# Patient Record
Sex: Female | Born: 1960 | Race: Black or African American | Hispanic: No | Marital: Single | Smoking: Current every day smoker
Health system: Southern US, Community
[De-identification: ages and names within clinical notes are randomized; demographics above are authoritative.]

## PROBLEM LIST (undated history)

## (undated) ENCOUNTER — Emergency Department (HOSPITAL_BASED_OUTPATIENT_CLINIC_OR_DEPARTMENT_OTHER): Payer: Medicaid Other

## (undated) DIAGNOSIS — T4145XA Adverse effect of unspecified anesthetic, initial encounter: Secondary | ICD-10-CM

## (undated) DIAGNOSIS — F32A Depression, unspecified: Secondary | ICD-10-CM

## (undated) DIAGNOSIS — F329 Major depressive disorder, single episode, unspecified: Secondary | ICD-10-CM

## (undated) DIAGNOSIS — T8859XA Other complications of anesthesia, initial encounter: Secondary | ICD-10-CM

## (undated) DIAGNOSIS — J302 Other seasonal allergic rhinitis: Secondary | ICD-10-CM

## (undated) DIAGNOSIS — M1990 Unspecified osteoarthritis, unspecified site: Secondary | ICD-10-CM

## (undated) DIAGNOSIS — F419 Anxiety disorder, unspecified: Secondary | ICD-10-CM

## (undated) DIAGNOSIS — I10 Essential (primary) hypertension: Secondary | ICD-10-CM

## (undated) HISTORY — DX: Depression, unspecified: F32.A

## (undated) HISTORY — DX: Major depressive disorder, single episode, unspecified: F32.9

## (undated) HISTORY — DX: Other seasonal allergic rhinitis: J30.2

## (undated) HISTORY — DX: Essential (primary) hypertension: I10

## (undated) HISTORY — PX: POLYPECTOMY: SHX149

## (undated) HISTORY — PX: COLONOSCOPY: SHX174

## (undated) HISTORY — DX: Anxiety disorder, unspecified: F41.9

---

## 1999-11-22 ENCOUNTER — Other Ambulatory Visit: Admission: RE | Admit: 1999-11-22 | Discharge: 1999-11-22 | Payer: Self-pay | Admitting: Obstetrics and Gynecology

## 2000-12-03 ENCOUNTER — Other Ambulatory Visit: Admission: RE | Admit: 2000-12-03 | Discharge: 2000-12-03 | Payer: Self-pay | Admitting: Obstetrics and Gynecology

## 2001-12-31 ENCOUNTER — Other Ambulatory Visit: Admission: RE | Admit: 2001-12-31 | Discharge: 2001-12-31 | Payer: Self-pay | Admitting: Obstetrics and Gynecology

## 2003-01-28 ENCOUNTER — Other Ambulatory Visit: Admission: RE | Admit: 2003-01-28 | Discharge: 2003-01-28 | Payer: Self-pay | Admitting: Obstetrics and Gynecology

## 2004-01-06 ENCOUNTER — Other Ambulatory Visit: Admission: RE | Admit: 2004-01-06 | Discharge: 2004-01-06 | Payer: Self-pay | Admitting: Obstetrics and Gynecology

## 2004-02-29 ENCOUNTER — Inpatient Hospital Stay (HOSPITAL_COMMUNITY): Admission: AD | Admit: 2004-02-29 | Discharge: 2004-02-29 | Payer: Self-pay | Admitting: Obstetrics & Gynecology

## 2004-05-31 ENCOUNTER — Encounter: Admission: RE | Admit: 2004-05-31 | Discharge: 2004-05-31 | Payer: Self-pay | Admitting: Obstetrics and Gynecology

## 2004-07-12 ENCOUNTER — Inpatient Hospital Stay (HOSPITAL_COMMUNITY): Admission: AD | Admit: 2004-07-12 | Discharge: 2004-07-17 | Payer: Self-pay | Admitting: Obstetrics and Gynecology

## 2004-07-12 ENCOUNTER — Inpatient Hospital Stay (HOSPITAL_COMMUNITY): Admission: AD | Admit: 2004-07-12 | Discharge: 2004-07-12 | Payer: Self-pay | Admitting: Obstetrics and Gynecology

## 2004-07-13 ENCOUNTER — Encounter (INDEPENDENT_AMBULATORY_CARE_PROVIDER_SITE_OTHER): Payer: Self-pay | Admitting: Specialist

## 2004-07-18 ENCOUNTER — Encounter: Admission: RE | Admit: 2004-07-18 | Discharge: 2004-08-17 | Payer: Self-pay | Admitting: Obstetrics and Gynecology

## 2004-08-30 ENCOUNTER — Other Ambulatory Visit: Admission: RE | Admit: 2004-08-30 | Discharge: 2004-08-30 | Payer: Self-pay | Admitting: Obstetrics and Gynecology

## 2005-12-07 IMAGING — US US FETAL BPP W/O NONSTRESS
1 series · 13 of 28 positions shown · non-contrast
Comparison: none

CLINICAL DATA: BPP; non-reactive non-stress test; evaluate fetal weight; assigned gestational age is 39 weeks 3 days.

[Series 1: us fetal bpp w/o nonstress · 0.43mm/px · 13 of 55 slices shown]
[im 3/55]
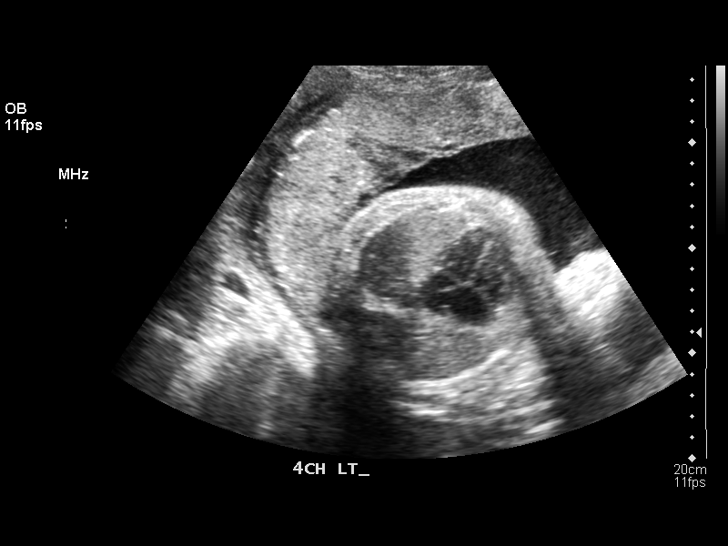
[im 7/55]
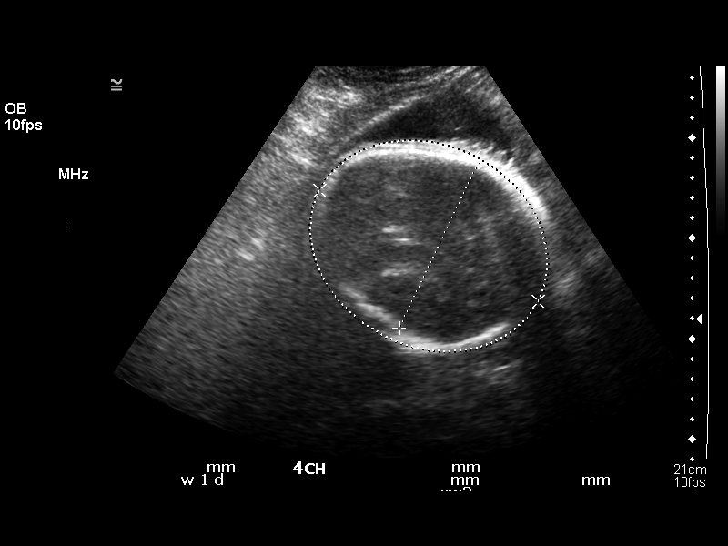
[im 11/55]
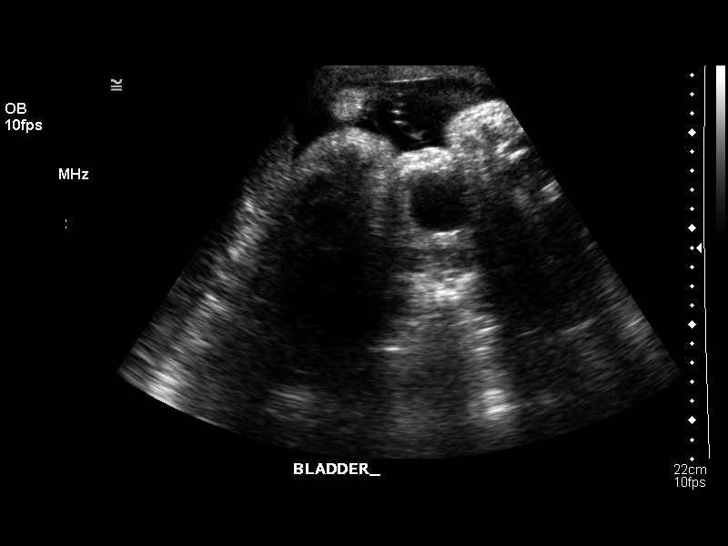
[im 15/55]
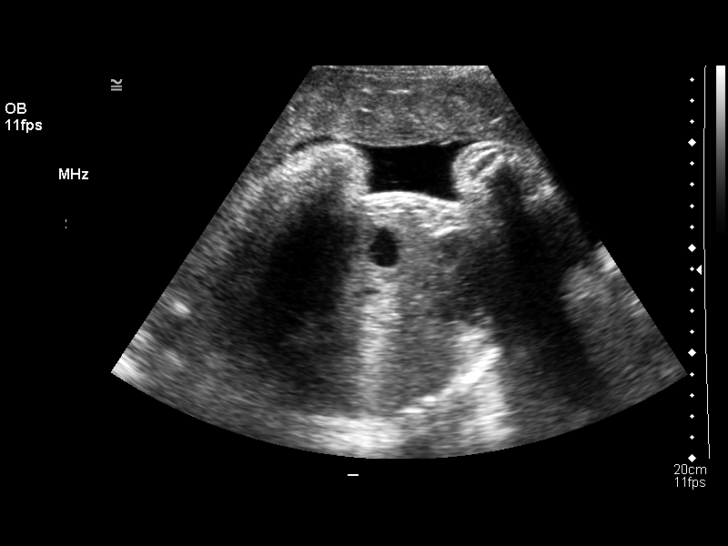
[im 19/55]
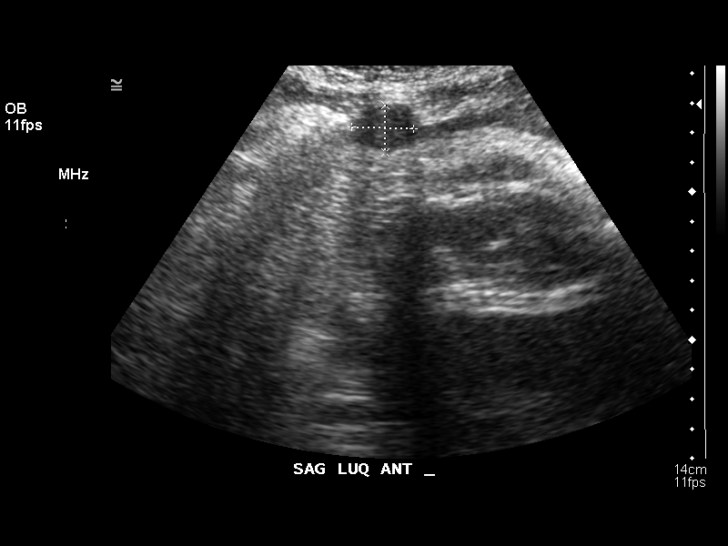
[im 23/55]
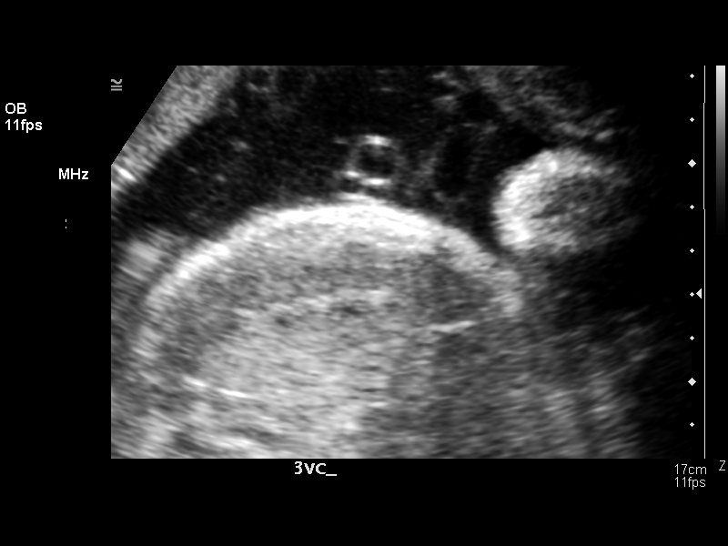
[im 29/55]
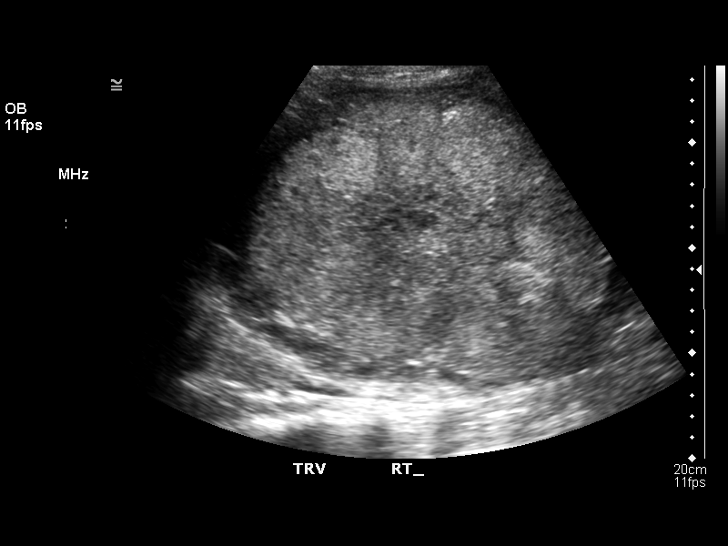
[im 33/55]
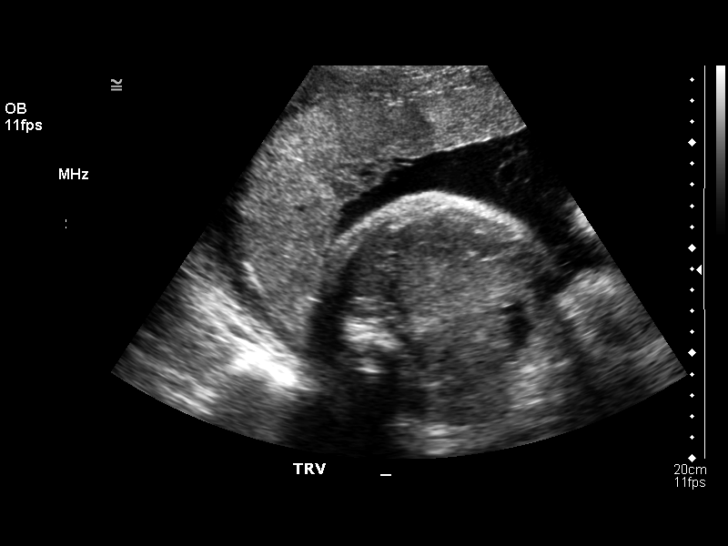
[im 37/55]
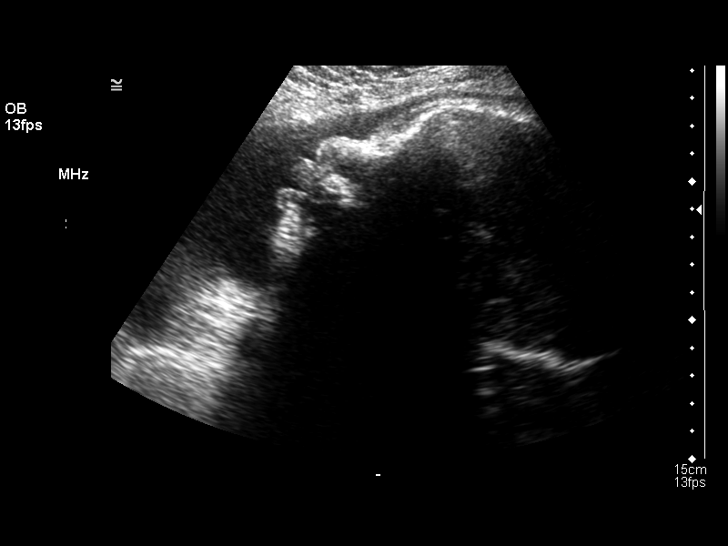
[im 41/55]
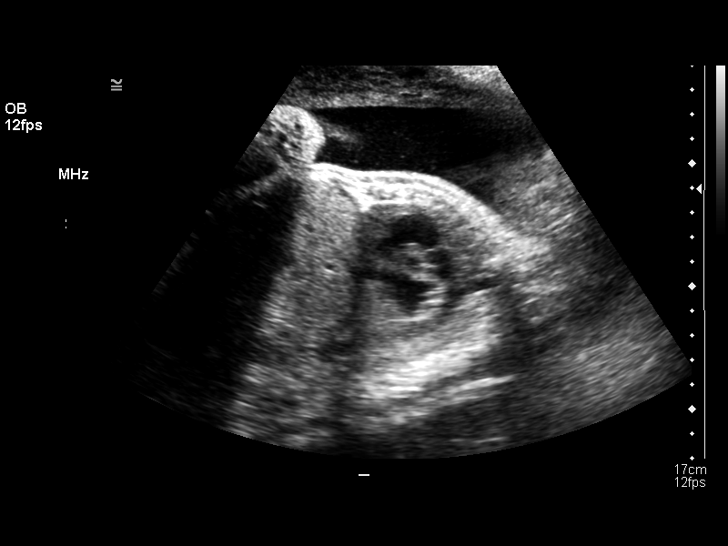
[im 45/55]
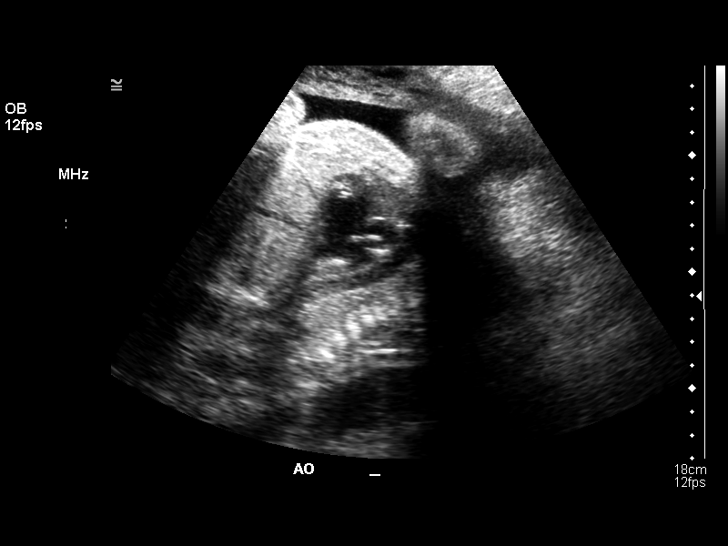
[im 49/55]
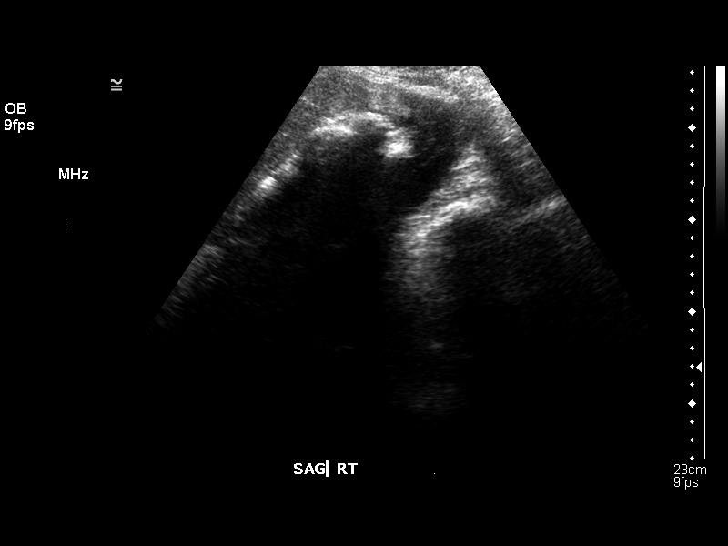
[im 53/55]
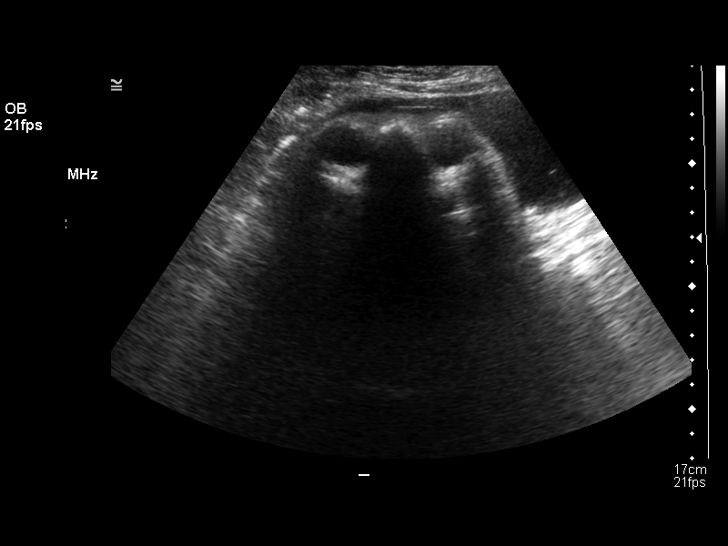

[13 of 28 positions shown; findings below may reference images not displayed]

OBSTETRICAL ULTRASOUND
Number of Fetuses:  1
Heart Rate:  157
Movement:  Yes
Breathing:  Yes  
Presentation:  Cephalic
Placental Location:  Anterior, right lateral
Grade:  II
Previa:  No
Amniotic Fluid (Subjective):  Normal
Amniotic Fluid (Objective):   20.8 cm AFI (5th -95th%ile = 7.2 ? 22.6 cm for 39 wks)

FETAL BIOMETRY
BPD:  9.2 cm   37 w 3 d
HC:  34.7 cm   40 w 2 d
AC:  37.3 cm   41 w 2 d
FL:    7.6 cm   38 w 4 d

MEAN GA:  39 w 3 d

EFW:  1793 g (H) 90th ? 95th%ile (4799 ? 7777 g) For 39 wks

FETAL ANATOMY
Lateral Ventricles:    Not visualized 
Thalami/CSP:      Thalami visualized
Posterior Fossa:  Not visualized 
Nuchal Region:    N/A
Spine:      Not visualized 
4 Chamber Heart on Left:      Visualized 
Stomach on Left:      Visualized 
3 Vessel Cord:    Visualized  
Cord Insertion site:    Not visualized 
Kidneys:  Not visualized 
Bladder:  Visualized 
Extremities:      Not visualized 

ADDITIONAL ANATOMY VISUALIZED:  LVOT, RVOT, orbits, profile, diaphragm, ductal arch, aortic arch, and female genitalia. 

Evaluation limited by:  Fetal position and advanced gestational age 

MATERNAL FINDINGS
Cervix:   No evaluated.  
Fibroids visualized:  ant/midline 9.0 x 3.6 x 6.1 cm, LUQ/ant 2.1 x 1.6 x 3.5 cm, and left/ant/lat  6.8 x 4.1 x 6.4 cm.

BIOPHYSICAL PROFILE

Movement:  2    Time:  20 minutes
Breathing:  2
Tone:  2
Amniotic Fluid:  2

Total Score:  8
IMPRESSION: There is a single living intrauterine gestation in cephalic presentation.  The estimated fetal weight is at the 90th ? 95th percentile for a 39 week gestation.  The estimated fetal weight is 1793 + / - 602 grams.  Please note that the anatomy scan is extremely limited due to the advanced gestational age and fetal position.  
Several fibroids are visualized; the largest of these is anterior at the midline measuring 9.0 cm.  
The biophysical score is [DATE] over a 20 minute period.  

</u12:p>

## 2006-01-10 ENCOUNTER — Other Ambulatory Visit: Admission: RE | Admit: 2006-01-10 | Discharge: 2006-01-10 | Payer: Self-pay | Admitting: Obstetrics and Gynecology

## 2006-09-25 DIAGNOSIS — I10 Essential (primary) hypertension: Secondary | ICD-10-CM | POA: Insufficient documentation

## 2008-06-17 ENCOUNTER — Ambulatory Visit: Payer: Self-pay | Admitting: Nurse Practitioner

## 2008-06-17 DIAGNOSIS — E669 Obesity, unspecified: Secondary | ICD-10-CM

## 2008-06-17 DIAGNOSIS — F17200 Nicotine dependence, unspecified, uncomplicated: Secondary | ICD-10-CM | POA: Insufficient documentation

## 2008-06-17 DIAGNOSIS — F411 Generalized anxiety disorder: Secondary | ICD-10-CM | POA: Insufficient documentation

## 2008-06-17 LAB — CONVERTED CEMR LAB: Pap Smear: NORMAL

## 2008-10-01 ENCOUNTER — Ambulatory Visit: Payer: Self-pay | Admitting: Nurse Practitioner

## 2008-10-01 ENCOUNTER — Encounter (INDEPENDENT_AMBULATORY_CARE_PROVIDER_SITE_OTHER): Payer: Self-pay | Admitting: Nurse Practitioner

## 2008-10-01 DIAGNOSIS — R3129 Other microscopic hematuria: Secondary | ICD-10-CM

## 2008-10-01 DIAGNOSIS — B379 Candidiasis, unspecified: Secondary | ICD-10-CM | POA: Insufficient documentation

## 2008-10-01 DIAGNOSIS — D259 Leiomyoma of uterus, unspecified: Secondary | ICD-10-CM | POA: Insufficient documentation

## 2008-10-01 LAB — CONVERTED CEMR LAB
Glucose, Urine, Semiquant: NEGATIVE
KOH Prep: NEGATIVE
Nitrite: NEGATIVE
OCCULT 1: NEGATIVE
Protein, U semiquant: 30
Specific Gravity, Urine: >=1.03
Urobilinogen, UA: 0.2
WBC Urine, dipstick: NEGATIVE
pH: 5

## 2008-10-02 ENCOUNTER — Encounter (INDEPENDENT_AMBULATORY_CARE_PROVIDER_SITE_OTHER): Payer: Self-pay | Admitting: Nurse Practitioner

## 2008-10-05 ENCOUNTER — Encounter (INDEPENDENT_AMBULATORY_CARE_PROVIDER_SITE_OTHER): Payer: Self-pay | Admitting: Nurse Practitioner

## 2008-10-05 DIAGNOSIS — E7800 Pure hypercholesterolemia, unspecified: Secondary | ICD-10-CM | POA: Insufficient documentation

## 2008-10-05 LAB — CONVERTED CEMR LAB
ALT: 10 units/L (ref 0–35)
AST: 12 units/L (ref 0–37)
Albumin: 3.9 g/dL (ref 3.5–5.2)
Alkaline Phosphatase: 64 units/L (ref 39–117)
BUN: 18 mg/dL (ref 6–23)
Basophils Absolute: 0 10*3/uL (ref 0.0–0.1)
Basophils Relative: 0 % (ref 0–1)
CO2: 23 meq/L (ref 19–32)
Calcium: 8.7 mg/dL (ref 8.4–10.5)
Chlamydia, DNA Probe: NEGATIVE
Chloride: 103 meq/L (ref 96–112)
Cholesterol: 233 mg/dL — ABNORMAL HIGH (ref 0–200)
Creatinine, Ser: 0.69 mg/dL (ref 0.40–1.20)
Eosinophils Absolute: 0.1 10*3/uL (ref 0.0–0.7)
Eosinophils Relative: 1 % (ref 0–5)
GC Probe Amp, Genital: NEGATIVE
Glucose, Bld: 97 mg/dL (ref 70–99)
HCT: 43.7 % (ref 36.0–46.0)
HDL: 69 mg/dL (ref >39)
Hemoglobin: 13.8 g/dL (ref 12.0–15.0)
LDL Cholesterol: 151 mg/dL — ABNORMAL HIGH (ref 0–99)
Lymphocytes Relative: 20 % (ref 12–46)
Lymphs Abs: 1.6 10*3/uL (ref 0.7–4.0)
MCHC: 31.6 g/dL (ref 30.0–36.0)
MCV: 88.3 fL (ref 78.0–100.0)
Microalb, Ur: 2.14 mg/dL — ABNORMAL HIGH (ref 0.00–1.89)
Monocytes Absolute: 0.4 10*3/uL (ref 0.1–1.0)
Monocytes Relative: 5 % (ref 3–12)
Neutro Abs: 6 10*3/uL (ref 1.7–7.7)
Neutrophils Relative %: 74 % (ref 43–77)
Platelets: 281 10*3/uL (ref 150–400)
Potassium: 3.9 meq/L (ref 3.5–5.3)
RBC: 4.95 M/uL (ref 3.87–5.11)
RDW: 14.9 % (ref 11.5–15.5)
Sodium: 142 meq/L (ref 135–145)
TSH: 0.858 microintl units/mL (ref 0.350–4.50)
Total Bilirubin: 0.4 mg/dL (ref 0.3–1.2)
Total CHOL/HDL Ratio: 3.4
Total Protein: 7.1 g/dL (ref 6.0–8.3)
Triglycerides: 64 mg/dL (ref <150)
VLDL: 13 mg/dL (ref 0–40)
WBC: 8.2 10*3/uL (ref 4.0–10.5)

## 2008-10-06 ENCOUNTER — Telehealth (INDEPENDENT_AMBULATORY_CARE_PROVIDER_SITE_OTHER): Payer: Self-pay | Admitting: Nurse Practitioner

## 2008-10-13 ENCOUNTER — Encounter (INDEPENDENT_AMBULATORY_CARE_PROVIDER_SITE_OTHER): Payer: Self-pay | Admitting: Nurse Practitioner

## 2008-10-15 ENCOUNTER — Encounter (INDEPENDENT_AMBULATORY_CARE_PROVIDER_SITE_OTHER): Payer: Self-pay | Admitting: Nurse Practitioner

## 2008-11-06 ENCOUNTER — Ambulatory Visit (HOSPITAL_COMMUNITY): Admission: RE | Admit: 2008-11-06 | Discharge: 2008-11-06 | Payer: Self-pay | Admitting: Family Medicine

## 2009-01-20 ENCOUNTER — Ambulatory Visit: Payer: Self-pay | Admitting: Nurse Practitioner

## 2009-01-20 DIAGNOSIS — J309 Allergic rhinitis, unspecified: Secondary | ICD-10-CM | POA: Insufficient documentation

## 2009-01-20 LAB — CONVERTED CEMR LAB
ALT: 10 units/L (ref 0–35)
AST: 13 units/L (ref 0–37)
Albumin: 3.8 g/dL (ref 3.5–5.2)
Alkaline Phosphatase: 68 units/L (ref 39–117)
Bilirubin, Direct: 0.1 mg/dL (ref 0.0–0.3)
Cholesterol, target level: 200 mg/dL
Cholesterol: 185 mg/dL (ref 0–200)
HDL goal, serum: 40 mg/dL
HDL: 67 mg/dL (ref >39)
Indirect Bilirubin: 0.3 mg/dL (ref 0.0–0.9)
LDL Cholesterol: 107 mg/dL — ABNORMAL HIGH (ref 0–99)
LDL Goal: 160 mg/dL
Total Bilirubin: 0.4 mg/dL (ref 0.3–1.2)
Total CHOL/HDL Ratio: 2.8
Total Protein: 6.8 g/dL (ref 6.0–8.3)
Triglycerides: 57 mg/dL (ref <150)
VLDL: 11 mg/dL (ref 0–40)

## 2009-01-21 ENCOUNTER — Encounter (INDEPENDENT_AMBULATORY_CARE_PROVIDER_SITE_OTHER): Payer: Self-pay | Admitting: Nurse Practitioner

## 2009-03-11 ENCOUNTER — Telehealth (INDEPENDENT_AMBULATORY_CARE_PROVIDER_SITE_OTHER): Payer: Self-pay | Admitting: Nurse Practitioner

## 2009-06-14 ENCOUNTER — Telehealth (INDEPENDENT_AMBULATORY_CARE_PROVIDER_SITE_OTHER): Payer: Self-pay | Admitting: Nurse Practitioner

## 2009-06-24 ENCOUNTER — Ambulatory Visit: Payer: Self-pay | Admitting: Nurse Practitioner

## 2009-06-24 DIAGNOSIS — M79609 Pain in unspecified limb: Secondary | ICD-10-CM

## 2009-06-24 DIAGNOSIS — R000 Tachycardia, unspecified: Secondary | ICD-10-CM

## 2009-06-24 LAB — CONVERTED CEMR LAB
BUN: 20 mg/dL (ref 6–23)
CO2: 26 meq/L (ref 19–32)
Calcium: 9.5 mg/dL (ref 8.4–10.5)
Chloride: 105 meq/L (ref 96–112)
Creatinine, Ser: 0.92 mg/dL (ref 0.40–1.20)
Glucose, Bld: 106 mg/dL — ABNORMAL HIGH (ref 70–99)
Potassium: 4.3 meq/L (ref 3.5–5.3)
Sodium: 142 meq/L (ref 135–145)
TSH: 1.351 microintl units/mL (ref 0.350–4.500)

## 2009-06-25 ENCOUNTER — Encounter (INDEPENDENT_AMBULATORY_CARE_PROVIDER_SITE_OTHER): Payer: Self-pay | Admitting: Nurse Practitioner

## 2009-07-01 ENCOUNTER — Ambulatory Visit: Payer: Self-pay | Admitting: Nurse Practitioner

## 2009-07-01 DIAGNOSIS — J329 Chronic sinusitis, unspecified: Secondary | ICD-10-CM | POA: Insufficient documentation

## 2009-07-15 ENCOUNTER — Ambulatory Visit: Payer: Self-pay | Admitting: Nurse Practitioner

## 2009-08-27 ENCOUNTER — Ambulatory Visit: Payer: Self-pay | Admitting: Physician Assistant

## 2009-08-27 DIAGNOSIS — J069 Acute upper respiratory infection, unspecified: Secondary | ICD-10-CM | POA: Insufficient documentation

## 2009-09-01 ENCOUNTER — Telehealth: Payer: Self-pay | Admitting: Physician Assistant

## 2009-10-07 ENCOUNTER — Telehealth (INDEPENDENT_AMBULATORY_CARE_PROVIDER_SITE_OTHER): Payer: Self-pay | Admitting: Nurse Practitioner

## 2010-01-11 ENCOUNTER — Telehealth (INDEPENDENT_AMBULATORY_CARE_PROVIDER_SITE_OTHER): Payer: Self-pay | Admitting: Nurse Practitioner

## 2010-02-17 ENCOUNTER — Telehealth (INDEPENDENT_AMBULATORY_CARE_PROVIDER_SITE_OTHER): Payer: Self-pay | Admitting: Nurse Practitioner

## 2010-03-02 ENCOUNTER — Ambulatory Visit: Payer: Self-pay | Admitting: Nurse Practitioner

## 2010-03-02 DIAGNOSIS — M2550 Pain in unspecified joint: Secondary | ICD-10-CM | POA: Insufficient documentation

## 2010-03-03 ENCOUNTER — Encounter (INDEPENDENT_AMBULATORY_CARE_PROVIDER_SITE_OTHER): Payer: Self-pay | Admitting: Nurse Practitioner

## 2010-03-03 LAB — CONVERTED CEMR LAB
ALT: 11 units/L (ref 0–35)
AST: 13 units/L (ref 0–37)
Albumin: 4 g/dL (ref 3.5–5.2)
Alkaline Phosphatase: 61 units/L (ref 39–117)
BUN: 22 mg/dL (ref 6–23)
Basophils Absolute: 0 10*3/uL (ref 0.0–0.1)
Basophils Relative: 1 % (ref 0–1)
CO2: 24 meq/L (ref 19–32)
Calcium: 8.5 mg/dL (ref 8.4–10.5)
Chloride: 106 meq/L (ref 96–112)
Cholesterol: 220 mg/dL — ABNORMAL HIGH (ref 0–200)
Creatinine, Ser: 0.73 mg/dL (ref 0.40–1.20)
Eosinophils Absolute: 0.1 10*3/uL (ref 0.0–0.7)
Eosinophils Relative: 1 % (ref 0–5)
Glucose, Bld: 82 mg/dL (ref 70–99)
HCT: 42.5 % (ref 36.0–46.0)
HDL: 74 mg/dL (ref >39)
Hemoglobin: 13.4 g/dL (ref 12.0–15.0)
LDL Cholesterol: 137 mg/dL — ABNORMAL HIGH (ref 0–99)
Lymphocytes Relative: 24 % (ref 12–46)
Lymphs Abs: 1.9 10*3/uL (ref 0.7–4.0)
MCHC: 31.5 g/dL (ref 30.0–36.0)
MCV: 89.1 fL (ref 78.0–100.0)
Monocytes Absolute: 0.4 10*3/uL (ref 0.1–1.0)
Monocytes Relative: 6 % (ref 3–12)
Neutro Abs: 5.3 10*3/uL (ref 1.7–7.7)
Neutrophils Relative %: 69 % (ref 43–77)
Platelets: 279 10*3/uL (ref 150–400)
Potassium: 4.1 meq/L (ref 3.5–5.3)
RBC: 4.77 M/uL (ref 3.87–5.11)
RDW: 14.8 % (ref 11.5–15.5)
Rheumatoid fact SerPl-aCnc: <20 intl units/mL (ref 0–20)
Sodium: 142 meq/L (ref 135–145)
TSH: 0.743 microintl units/mL (ref 0.350–4.500)
Total Bilirubin: 0.3 mg/dL (ref 0.3–1.2)
Total CHOL/HDL Ratio: 3
Total Protein: 7.2 g/dL (ref 6.0–8.3)
Triglycerides: 46 mg/dL (ref <150)
VLDL: 9 mg/dL (ref 0–40)
WBC: 7.7 10*3/uL (ref 4.0–10.5)

## 2010-04-05 ENCOUNTER — Ambulatory Visit: Payer: Self-pay | Admitting: Physician Assistant

## 2010-04-05 LAB — CONVERTED CEMR LAB
Bilirubin Urine: NEGATIVE
Blood in Urine, dipstick: NEGATIVE
Glucose, Urine, Semiquant: NEGATIVE
Ketones, urine, test strip: NEGATIVE
Nitrite: NEGATIVE
Specific Gravity, Urine: >=1.03
Urobilinogen, UA: 0.2
WBC Urine, dipstick: NEGATIVE
pH: 5

## 2010-04-15 ENCOUNTER — Ambulatory Visit: Payer: Self-pay | Admitting: Nurse Practitioner

## 2010-04-15 LAB — CONVERTED CEMR LAB
Chlamydia, DNA Probe: NEGATIVE
GC Probe Amp, Genital: NEGATIVE
KOH Prep: NEGATIVE
Microalb, Ur: 3.58 mg/dL — ABNORMAL HIGH (ref 0.00–1.89)
OCCULT 1: NEGATIVE

## 2010-04-19 ENCOUNTER — Encounter (INDEPENDENT_AMBULATORY_CARE_PROVIDER_SITE_OTHER): Payer: Self-pay | Admitting: Nurse Practitioner

## 2010-04-19 LAB — CONVERTED CEMR LAB: Pap Smear: NEGATIVE

## 2010-05-03 ENCOUNTER — Telehealth (INDEPENDENT_AMBULATORY_CARE_PROVIDER_SITE_OTHER): Payer: Self-pay | Admitting: Nurse Practitioner

## 2010-05-04 ENCOUNTER — Telehealth (INDEPENDENT_AMBULATORY_CARE_PROVIDER_SITE_OTHER): Payer: Self-pay | Admitting: Nurse Practitioner

## 2010-06-27 ENCOUNTER — Emergency Department (HOSPITAL_COMMUNITY): Admission: EM | Admit: 2010-06-27 | Discharge: 2010-06-27 | Payer: Self-pay | Admitting: Family Medicine

## 2010-10-16 ENCOUNTER — Encounter: Payer: Self-pay | Admitting: Internal Medicine

## 2010-10-25 NOTE — Assessment & Plan Note (Signed)
Summary: Complete Physical Exam   Vital Signs:  Patient profile:   50 year old female Menstrual status:  2-3 years ago62 Weight:      259.8 pounds Temp:     97.9 degrees F oral Pulse rhythm:   regular BP sitting:   142 / 88  (left arm) Cuff size:   large  Vitals Entered By: Levon Hedger (April 15, 2010 10:25 AM) CC: Pap.Marland KitchenMarland Kitchenpt has been feverish, ear stopped up, sinus drainage x 2 days, Hypertension Management Is Patient Diabetic? No Pain Assessment Patient in pain? no       Does patient need assistance? Functional Status Self care Ambulation Normal   CC:  Pap.Marland KitchenMarland Kitchenpt has been feverish, ear stopped up, sinus drainage x 2 days, and Hypertension Management.  History of Present Illness:  Pt into the office to finish her CPE. Pt did not get have done on last visit because she had young child with her.  Child is also present with pt today as pt report she does have child care since she is not in school.   Pt was seen in this office on 04/05/2010 with upper respiratory symptoms. She reports that she took medications as per his direction.   She has continued to feel weak with intermittent fever. 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  Mammogram -  last done 11/06/2008 Pt has been scheduled several times by this office and has no-showed to those appts.  Hypertension History:      Positive major cardiovascular risk factors include hyperlipidemia, hypertension, and current tobacco user.  Negative major cardiovascular risk factors include female age less than 50 years old and no history of diabetes.        Further assessment for target organ damage reveals no history of ASHD, cardiac end-organ damage (CHF/LVH), stroke/TIA, peripheral vascular disease, renal insufficiency, or hypertensive retinopathy.     Medications Prior to Update: 1)  Lexapro 10 Mg Tabs (Escitalopram Oxalate) .... One Tablet By Mouth Daily For Mood 2)   Amlodipine Besylate 10 Mg Tabs (Amlodipine Besylate) .... Take One Tablet By Mouth Daily For Blood Pressure 3)  Hydrochlorothiazide 25 Mg Tabs (Hydrochlorothiazide) .... Take One Tablet By Mouth Daily For Blood Pressure 4)  Pravastatin Sodium 20 Mg Tabs (Pravastatin Sodium) .Marland Kitchen.. 1 Tablet By Mouth Nightly For Cholesterol 5)  Tessalon Perles 100 Mg Caps (Benzonatate) .... One Capsule By Mouth Two Times A Day As Needed For Cough 6)  Cetirizine Hcl 10 Mg Tabs (Cetirizine Hcl) .... Take 1 Tablet By Mouth Once A Day As Needed For Cough/post Nasal Drip. 7)  Ibuprofen 400 Mg Tabs (Ibuprofen) .... Take 1 Tablet By Mouth Three Times A Day As Needed For Pain  Allergies (verified): No Known Drug Allergies  Review of Systems General:  Complains of fatigue and fever. Eyes:  Denies blurring. ENT:  Complains of decreased hearing and nasal congestion. CV:  Denies chest pain or discomfort. Resp:  Complains of cough; denies shortness of breath and wheezing. GI:  Denies abdominal pain, nausea, and vomiting. GU:  Denies dysuria. MS:  Denies joint pain. Derm:  Denies dryness. Neuro:  Denies headaches. Psych:  Complains of anxiety; denies depression.  Physical Exam  General:  alert.   Head:  normocephalic.   Eyes:  pupils equal and pupils round.   Ears:  ear piercing(s) noted.   left ear - buldging (erythema) right ear - clear fluid Nose:  no nasal discharge.   Mouth:  fair dentition.   Neck:  supple.   Chest Wall:  no mass.   Breasts:  pendulous no masses.   Lungs:  normal breath sounds.   Heart:  normal rate and regular rhythm.   Abdomen:  soft, non-tender, and no distention.   Msk:  up to the exam table Pulses:  R radial normal and L radial normal.   Extremities:  no edema Neurologic:  alert & oriented X3.   Skin:  color normal.   Psych:  Oriented X3.     Impression & Recommendations:  Problem # 1:  ROUTINE GYNECOLOGICAL EXAMINATION (ICD-V72.31) labs done on previous visit rec optho  and dental exam mammogram - pt has missed at least 3 visits, advised her to inform this office when she is ready to scheduled guaiac negative EKG negative Orders: KOH/ WET [place] [phone]) Pap Smear, Thin Prep ( Collection of) (J8119) Hemoccult Guaiac-1 spec.(in office) (82270) UA Dipstick w/o Micro (manual) (14782) T- GC Chlamydia (95621)  Problem # 2:  SINUSITIS (ICD-473.9) advised pt that with the frequency of her sinusitis and URI that she needs to make some lifestyle changes. such as stopping smoking, stop washing hair and exposing to air conditioner all night spoke with pt regarding the resistant nature of antibiotics and the hesitancy to repeatedly prescribe Her updated medication list for this problem includes:    Tessalon Perles 100 Mg Caps (Benzonatate) ..... One capsule by mouth two times a day as needed for cough    Amoxicillin 500 Mg Tabs (Amoxicillin) ..... One tablet by mouth three times a day for infection  Problem # 3:  HYPERTENSION, BENIGN ESSENTIAL (ICD-401.1) Bp elevated today advised pt to take med as ordered Her updated medication list for this problem includes:    Amlodipine Besylate 10 Mg Tabs (Amlodipine besylate) .Marland Kitchen... Take one tablet by mouth daily for blood pressure    Hydrochlorothiazide 25 Mg Tabs (Hydrochlorothiazide) .Marland Kitchen... Take one tablet by mouth daily for blood pressure  Orders: T-Urine Microalbumin w/creat. ratio [phone]) EKG w/ Interpretation (93000)  Problem # 4:  TOBACCO ABUSE (ICD-305.1) advised pt this is likely she has recurrent URI and sinus problems  Complete Medication List: 1)  Lexapro 10 Mg Tabs (Escitalopram oxalate) .... One tablet by mouth daily for mood 2)  Amlodipine Besylate 10 Mg Tabs (Amlodipine besylate) .... Take one tablet by mouth daily for blood pressure 3)  Hydrochlorothiazide 25 Mg Tabs (Hydrochlorothiazide) .... Take one tablet by mouth daily for blood pressure 4)  Pravastatin Sodium 20 Mg Tabs (Pravastatin  sodium) .Marland Kitchen.. 1 tablet by mouth nightly for cholesterol 5)  Tessalon Perles 100 Mg Caps (Benzonatate) .... One capsule by mouth two times a day as needed for cough 6)  Cetirizine Hcl 10 Mg Tabs (Cetirizine hcl) .... Take 1 tablet by mouth once a day as needed for cough/post nasal drip. 7)  Ibuprofen 400 Mg Tabs (Ibuprofen) .... Take 1 tablet by mouth three times a day as needed for pain 8)  Amoxicillin 500 Mg Tabs (Amoxicillin) .... One tablet by mouth three times a day for infection  Hypertension Assessment/Plan:      The patient's hypertensive risk group is category B: At least one risk factor (excluding diabetes) with no target organ damage.  Her calculated 10 year risk of coronary heart disease is 6 %.  Today's blood pressure is 142/88.  Her blood pressure goal is < 140/90.  Patient Instructions: 1)  You have been scheduled at least 3 times for your mammogram.  You can  call when you are ready to make this appointment. 2)  You can call the office that did your last colonscopy and ask them when your next colonscopy will need to be.  I am unable to determine from your previous records. 3)  Your left ear has fluid and is red.  This is likely due to congestion for the past 2 week.  Continue to take allergy and cough med as ordered. 4)  Start amoxil 500mg  three times a day x 10 days 5)  Follow up as needed Prescriptions: AMOXICILLIN 500 MG TABS (AMOXICILLIN) One tablet by mouth three times a day for infection  #30 x 0   Entered and Authorized by:   Lehman Prom FNP   Signed by:   Lehman Prom FNP on 04/15/2010   Method used:   Print then Give to Patient   RxID:   [id number]   Prevention & Chronic Care Immunizations   Influenza vaccine: Fluvax 3+  (07/15/2009)    Tetanus booster: 09/26/2003: historical per pt    Pneumococcal vaccine: Not documented  Other Screening   Pap smear:  Specimen Adequacy: Satisfactory for evaluation.   Interpretation/Result:Negative for  intraepithelial Lesion or Malignancy.     (10/01/2008)   Pap smear action/deferral: PAP smear done  (10/01/2008)   Pap smear due: 04/16/2011    Mammogram: ASSESSMENT: Negative - BI-RADS 1^MM DIGITAL SCREENING  (11/06/2008)   Mammogram action/deferral: mammogram ordered  (10/01/2008)   Smoking status: current  (03/02/2010)   Smoking cessation counseling: yes  (06/17/2008)  Lipids   Total Cholesterol: 220  (03/02/2010)   LDL: 137  (03/02/2010)   LDL Direct: Not documented   HDL: 74  (03/02/2010)   Triglycerides: 46  (03/02/2010)    SGOT (AST): 13  (03/02/2010)   SGPT (ALT): 11  (03/02/2010)   Alkaline phosphatase: 61  (03/02/2010)   Total bilirubin: 0.3  (03/02/2010)  Hypertension   Last Blood Pressure: 142 / 88  (04/15/2010)   Serum creatinine: 0.73  (03/02/2010)   Serum potassium 4.1  (03/02/2010)  Self-Management Support :    Hypertension self-management support: Not documented    Lipid self-management support: Not documented     EKG  Procedure date:  04/15/2010  Findings:      NSR  Laboratory Results  Date/Time Received: April 15, 2010 1:58 PM   [place] Source: vaginal WBC/hpf: 1-5 Bacteria/hpf: rare Clue cells/hpf: none Yeast/hpf: none [place] KOH: Negative Trichomonas/hpf: none  Stool - Occult Blood Hemmoccult #1: negative Date: 04/15/2010

## 2010-10-25 NOTE — Progress Notes (Signed)
Summary: NEED REFILLS UNTIL HER APPT.  Phone Note Call from Patient Call back at Home Phone [phone]   Reason for Call: Refill Medication Summary of Call: MARTIN PT. [place] CALLED AND RESCHED. HER CPP ON 05/17 AND SHE WANTS TO KNOW IF SHE CAN GET A REFILL ON HER BP, AND HER FLUID PILL FILLED UNTIL HER APPT.  AND SHE USES THE [facility]PT [facility]. Initial call taken by: Leodis Rains,  January 11, 2010 11:48 AM  Follow-up for Phone Call        forward to N. Daphine Deutscher, FNP Follow-up by: Levon Hedger,  January 11, 2010 12:52 PM  Additional Follow-up for Phone Call Additional follow up Details #1::        both these medications were refilled on 12/09/2009 with 3 refills she needs to check with the pharmacy Additional Follow-up by: Lehman Prom FNP,  January 11, 2010 1:03 PM    Additional Follow-up for Phone Call Additional follow up Details #2::    Pt notified. Follow-up by: Vesta Mixer CMA,  January 13, 2010 9:54 AM

## 2010-10-25 NOTE — Assessment & Plan Note (Signed)
Summary: 3629 PT//SINUS INFECTION///[place]   Vital Signs:  Patient profile:   50 year old female Menstrual status:  2-3 years ago62 Height:      62 inches Weight:      258 pounds BMI:     47.36 Temp:     98.3 degrees F oral Pulse rate:   91 / minute Pulse rhythm:   regular Resp:     18 per minute BP sitting:   120 / 82  (left arm) Cuff size:   large  Vitals Entered By: [place] Shannon (April 05, 2010 10:20 AM) CC: pt says she has sinus issues.... pt says she can feel it in her throat.... yellow mucus through her nose when she blows... Is Patient Diabetic? No Pain Assessment Patient in pain? no       Does patient need assistance? Functional Status Self care Ambulation Normal   Primary Care Provider:  Lehman Prom FNP  CC:  pt says she has sinus issues.... pt says she can feel it in her throat.... yellow mucus through her nose when she blows....  History of Present Illness: Here for ? sinus infection.  Nephew in house with recent illness.  Started feeling sick 2-3 days ago.  Feeling drainage down throat.  Felt feverish last night.  No temp today.  +cough - no production.  Does have some pain in max area bilaterally.  No headaches.  May have some nasal congestion.  No sore throat.  No otalgia.  No chest pain or dyspnea.  Using cough drops and tea with lemon.  Used robitussin for cough.    Problems Prior to Update: 1)  Sinusitis  (ICD-473.9) 2)  Pain in Joint, Multiple Sites  (ICD-719.49) 3)  Uri  (ICD-465.9) 4)  Need Prophylactic Vaccination&inoculation Flu  (ICD-V04.81) 5)  Sinusitis  (ICD-473.9) 6)  Tachycardia  (ICD-785.0) 7)  Leg Pain, Right  (ICD-729.5) 8)  Allergic Rhinitis  (ICD-477.9) 9)  Hypercholesterolemia  (ICD-272.0) 10)  Candidiasis  (ICD-112.9) 11)  Microscopic Hematuria  (ICD-599.72) 12)  Fibroids, Uterus  (ICD-218.9) 13)  Routine Gynecological Examination  (ICD-V72.31) 14)  Unspecified Breast Screening  (ICD-V76.10) 15)  Obesity  (ICD-278.00) 16)   Tobacco Abuse  (ICD-305.1) 17)  Anxiety Disorder  (ICD-300.00) 18)  Hypertension, Benign Essential  (ICD-401.1)  Current Medications (verified): 1)  Lexapro 10 Mg Tabs (Escitalopram Oxalate) .... One Tablet By Mouth Daily For Mood 2)  Amlodipine Besylate 10 Mg Tabs (Amlodipine Besylate) .... Take One Tablet By Mouth Daily For Blood Pressure 3)  Hydrochlorothiazide 25 Mg Tabs (Hydrochlorothiazide) .... Take One Tablet By Mouth Daily For Blood Pressure 4)  Pravastatin Sodium 20 Mg Tabs (Pravastatin Sodium) .Marland Kitchen.. 1 Tablet By Mouth Nightly For Cholesterol 5)  Loratadine 10 Mg Tabs (Loratadine) .... One Tablet By Mouth Daily For Allergies 6)  Tessalon Perles 100 Mg Caps (Benzonatate) .... One Capsule By Mouth Two Times A Day As Needed For Cough  Allergies (verified): No Known Drug Allergies  Past History:  Social History: Last updated: 06/17/2008 single 1 child tobacco use - 5 cigs per day ETOH - wine on weekend Drug - no  Risk Factors: Smoking Status: current (03/02/2010) Packs/Day: 5 cigs/day (03/02/2010) Cans of tobacco/wk: no (06/17/2008) Passive Smoke Exposure: no (03/02/2010)  Physical Exam  General:  alert, well-developed, and well-nourished.   Head:  normocephalic and atraumatic.   Eyes:  pupils equal, pupils round, pupils reactive to light, and no injection.   Ears:  R ear normal and L ear normal.   Nose:  no external deformity and no mucosal edema.   Mouth:  exam difficult most of post pharynx seen; no erythema or exudate Neck:  supple and no cervical lymphadenopathy.   Lungs:  normal breath sounds, no crackles, and no wheezes.   Heart:  normal rate and regular rhythm.   Neurologic:  alert & oriented X3 and cranial nerves II-XII intact.   Psych:  normally interactive.     Impression & Recommendations:  Problem # 1:  URI (ICD-465.9) spent a lot of time explaining to patient why she does not need antibxs she understands symptomatic tx at this time f/u if no  better or sooner if worse.  The following medications were removed from the medication list:    Loratadine 10 Mg Tabs (Loratadine) ..... One tablet by mouth daily for allergies Her updated medication list for this problem includes:    Tessalon Perles 100 Mg Caps (Benzonatate) ..... One capsule by mouth two times a day as needed for cough    Cetirizine Hcl 10 Mg Tabs (Cetirizine hcl) .Marland Kitchen... Take 1 tablet by mouth once a day as needed for cough/post nasal drip.    Ibuprofen 400 Mg Tabs (Ibuprofen) .Marland Kitchen... Take 1 tablet by mouth three times a day as needed for pain  Complete Medication List: 1)  Lexapro 10 Mg Tabs (Escitalopram oxalate) .... One tablet by mouth daily for mood 2)  Amlodipine Besylate 10 Mg Tabs (Amlodipine besylate) .... Take one tablet by mouth daily for blood pressure 3)  Hydrochlorothiazide 25 Mg Tabs (Hydrochlorothiazide) .... Take one tablet by mouth daily for blood pressure 4)  Pravastatin Sodium 20 Mg Tabs (Pravastatin sodium) .Marland Kitchen.. 1 tablet by mouth nightly for cholesterol 5)  Tessalon Perles 100 Mg Caps (Benzonatate) .... One capsule by mouth two times a day as needed for cough 6)  Cetirizine Hcl 10 Mg Tabs (Cetirizine hcl) .... Take 1 tablet by mouth once a day as needed for cough/post nasal drip. 7)  Ibuprofen 400 Mg Tabs (Ibuprofen) .... Take 1 tablet by mouth three times a day as needed for pain  Patient Instructions: 1)  Get plenty of rest, drink lots of clear liquids, and use Tylenol or Ibuprofen for fever and comfort. Return in 7-10 days if you're not better: sooner if you'er feeling worse.  2)  Take 650 - 1000 mg of tylenol every 4-6 hours as needed for relief of pain or comfort of fever. Avoid taking more than 4000 mg in a 24 hour period( can cause liver damage in higher doses).  3)  Use Cetirizine (generic Zyrtec) once a day as needed for symptoms. 4)  You can use benadryl instead of zyrtec if you want. 5)  Use the ibuprofen as directed for the next 2-3 days, then  as needed.  Make sure you take with food. 6)  Ibuprofen and zyrtec have been sent to the [facility] on [address] Prescriptions: IBUPROFEN 400 MG TABS (IBUPROFEN) Take 1 tablet by mouth three times a day as needed for pain  #30 x 1   Entered and Authorized by:   Tereso Newcomer PA-C   Signed by:   Tereso Newcomer PA-C on 04/05/2010   Method used:   Electronically to        [facility] [phone]* (retail)       [address]       [place], [place]  [address]       Ph: [phone]       Fax: [phone]   RxID:  [id number] CETIRIZINE HCL 10 MG TABS (CETIRIZINE HCL) Take 1 tablet by mouth once a day as needed for cough/post nasal drip.  #30 x 1   Entered and Authorized by:   Tereso Newcomer PA-C   Signed by:   Tereso Newcomer PA-C on 04/05/2010   Method used:   Electronically to        [facility] [phone]* (retail)       [address]       [place], [place]  [address]       Ph: [phone]       Fax: [phone]   RxID:   [id number]   Laboratory Results   Urine Tests  Date/Time Received: April 15, 2010 5:08 PM   Routine Urinalysis   Color: lt. yellow Appearance: Clear Glucose: negative   (Normal Range: Negative) Bilirubin: negative   (Normal Range: Negative) Ketone: negative   (Normal Range: Negative) Spec. Gravity: >=1.030   (Normal Range: 1.003-1.035) Blood: negative   (Normal Range: Negative) pH: 5.0   (Normal Range: 5.0-8.0) Protein: trace   (Normal Range: Negative) Urobilinogen: 0.2   (Normal Range: 0-1) Nitrite: negative   (Normal Range: Negative) Leukocyte Esterace: negative   (Normal Range: Negative)

## 2010-10-25 NOTE — Letter (Signed)
Summary: *HSN Results Follow up  [facility]  [address] [place], [place] [address]   Phone: [phone]  Fax: [phone]      04/19/2010   [name] [address] [place], [place]  [address]   Dear  [name],                            ____S.Drinkard,FNP   ____D. Gore,FNP       ____B. McPherson,MD   ____V. Rankins,MD    ____E. Mulberry,MD    _X___N. Daphine Deutscher, FNP  ____D. Reche Dixon, MD    ____K. Philipp Deputy, MD    ____Other     This letter is to inform you that your recent test(s):  ___X____Pap Smear    _______Lab Test     _______X-ray    ___X____ is within acceptable limits  _______ requires a medication change  _______ requires a follow-up lab visit  _______ requires a follow-up visit with your provider   Comments: Pap Smear results are normal.        _________________________________________________________ If you have any questions, please contact our office [phone].                    Sincerely,    Kimberly Prom FNP [facility]

## 2010-10-25 NOTE — Progress Notes (Signed)
Summary: Lexapro samples  Phone Note Call from Patient Call back at Home Phone [phone]   Summary of Call: Pt would like to know if she can get some samples from lexapro.  Please call her back. [facility] FNP Initial call taken by: Manon Hilding,  October 07, 2009 11:57 AM  Follow-up for Phone Call        WILL SEND MESSAGE BACK TO Towanna Avery//BRENDA WILL CALL BACK//[phone]//CAN'T BREATH PANIC Follow-up by: Levon Hedger,  October 12, 2009 9:19 AM  Additional Follow-up for Phone Call Additional follow up Details #1::        will give samples for 2 weeks. Samples are not intended for continued use - only to sample and make sure that pt tolerates the meds. Since she is then she can get a prescription Discussed with pt previously that she will need to get Rx from [facility] for [facility]ro.  Give Rx to pt along with samples.    Additional Follow-up by: Levon Hedger,  October 12, 2009 9:27 AM    Additional Follow-up for Phone Call Additional follow up Details #2::    pt informed and will have someone to come pick up samples and will present ID to pick up for pt. Follow-up by: Levon Hedger,  October 12, 2009 9:39 AM  Prescriptions: LEXAPRO 10 MG TABS (ESCITALOPRAM OXALATE) One tablet by mouth daily for mood  #30 x 5   Entered by:   Levon Hedger   Authorized by:   Lehman Prom FNP   Signed by:   Levon Hedger on 10/12/2009   Method used:   Printed then faxed to ...       [facility] (retail)       [address] [place], [place]  [address]       Ph: [phone]       Fax: [phone]   RxID:   [id number] LEXAPRO 10 MG TABS (ESCITALOPRAM OXALATE) One tablet by mouth daily for mood  #28 x 0   Entered by:   Levon Hedger   Authorized by:   Lehman Prom FNP   Signed by:   Levon Hedger on 10/12/2009   Method used:   Samples Given   RxID:   [id number]

## 2010-10-25 NOTE — Progress Notes (Signed)
Summary: norvasc medication//gk  Phone Note Call from Patient Call back at [phone]   Summary of Call: The pt states that the norvasc cost around 110.00 and she is wondered if the provider can call in a generic medication at [facility] located at [address] [facility] FNP Initial call taken by: Manon Hilding,  May 04, 2010 2:43 PM  Follow-up for Phone Call        foward to N. Daphine Deutscher, fnp Follow-up by: Levon Hedger,  May 04, 2010 2:57 PM  Additional Follow-up for Phone Call Additional follow up Details #1::        See previous phone note - PT REQUESTED THE BRAND NAME Advise pt to make up her mind, will not keep changing and resending meds as this is very time consuming for staff- what was wrong with the amlodipine that she was getting before - that is the generic for norvasc Additional Follow-up by: Lehman Prom FNP,  May 04, 2010 3:18 PM    Additional Follow-up for Phone Call Additional follow up Details #2::    Rx faxed to [facility] [address]  Pt informed of above information. Follow-up by: Levon Hedger,  May 05, 2010 9:55 AM  Additional Follow-up for Phone Call Additional follow up Details #3:: Details for Additional Follow-up Action Taken: Rx resent - pt can get generic Additional Follow-up by: Lehman Prom FNP,  May 05, 2010 9:57 AM  New/Updated Medications: AMLODIPINE BESYLATE 10 MG TABS (AMLODIPINE BESYLATE) One tablet by mouth daily for blood pressure Prescriptions: AMLODIPINE BESYLATE 10 MG TABS (AMLODIPINE BESYLATE) One tablet by mouth daily for blood pressure  #30 x 5   Entered and Authorized by:   Lehman Prom FNP   Signed by:   Lehman Prom FNP on 05/05/2010   Method used:   Electronically to        [facility] [id number]* (retail)       [address]       [place], [place]  [address]       Ph: [phone]       Fax: [phone]   RxID:   [id number]

## 2010-10-25 NOTE — Progress Notes (Signed)
Summary: REFILL ON NORVASC  Phone Note Call from Patient Call back at Home Phone [phone]   Reason for Call: Refill Medication Summary of Call: MARTIN PT MS Goldie SAYS THAT SHE NEEDS HER AMLODIPINE CALLED INTO [facility]  ON [address] SHE MISPLACED THE RX THAT WAS GIVEN TO HER IN JUNE, BECAUSE THE [facility] WAS GIVING HER SAMPLES.   SHE DOESN'T WANT THE GENERIC BRAND SHE WANTS NORVASC Initial call taken by: Leodis Rains,  May 03, 2010 1:00 PM  Follow-up for Phone Call        forward to N. Daphine Deutscher, fnp Follow-up by: Levon Hedger,  May 03, 2010 2:25 PM  Additional Follow-up for Phone Call Additional follow up Details #1::        will send Rx to the pharmacy per pt request but advise pt that she will pay a substantial amount for the brand name Additional Follow-up by: Lehman Prom FNP,  May 03, 2010 2:45 PM    Additional Follow-up for Phone Call Additional follow up Details #2::    pt informed of above information. Follow-up by: Levon Hedger,  May 03, 2010 3:09 PM  New/Updated Medications: NORVASC 10 MG TABS (AMLODIPINE BESYLATE) One tablet by mouth daily for blood pressure [BMN] Prescriptions: NORVASC 10 MG TABS (AMLODIPINE BESYLATE) One tablet by mouth daily for blood pressure Brand medically necessary #30 x 5   Entered and Authorized by:   Lehman Prom FNP   Signed by:   Lehman Prom FNP on 05/03/2010   Method used:   Electronically to        [facility] [phone]* (retail)       [address]       [place], [place]  [address]       Ph: [phone]       Fax: [phone]   RxID:   [id number]

## 2010-10-25 NOTE — Progress Notes (Signed)
Summary: REQUESTING MED TO BE CALLED IN///  Phone Note Call from Patient Call back at Home Phone [phone] Call back at [phone]   Summary of Call: Since last three days, she has fever, cough and chest congestion and she would like to know if the provider can call in something to the pharmacy.  [facility]).  I offered the 9 am app but she cannot come today only tomorrow. [facility] FnP Initial call taken by: Manon Hilding,  Feb 17, 2010 8:26 AM  Follow-up for Phone Call        MS Warshawsky HAS  CALLED TWICE  SINCE CALLING THIS MORNING, TO SEE IF SHE IS GOING TO GET SOMETHING CALLED IN. Follow-up by: Leodis Rains,  Feb 17, 2010 3:11 PM  Additional Follow-up for Phone Call Additional follow up Details #1::        Levon Hedger  Feb 17, 2010 4:56 PM Spoke with pt she said that she has been running a fever, cough, runny nose , chest congestion for the last three days.  She has normal appetite and has been drinking fine.  She said that she has been taking Robitussin. BC powder, Tylenol, Tylenol cold and has got no relief.  I asked pt had she been around anyone sick and she said her daughter had something first and has since gotten better.  She wants to know what she can take.  She said her fever has not been over 101 she will not let it get any higher than that.  I told pt that I would get this to her provider.    Additional Follow-up for Phone Call Additional follow up Details #2::    Will call in loratadine 10mg  for allergies as congestion may be due to allergic rhinitis of which she has a history. will also send tessalon pearles 100mg  by mouth two times a day as needed for cough pt will need an appointment if symptoms continue she had an appt recently ?? if she no showed or rescheduled but she didn't come so hopefully she rescheduled Follow-up by: Lehman Prom FNP,  Feb 18, 2010 8:07 AM  Additional Follow-up for Phone Call Additional follow up Details #3:: Details for  Additional Follow-up Action Taken: pt informed and said that she had to go to Urgent care at [facility] over the weekend and was treated for sinus and bronchitis.  Levon Hedger  Feb 22, 2010 5:07 PM  ok. glad pt was treated appropropriately n.martin,fnp  February 23, 2010  8:07 AM     New/Updated Medications: LORATADINE 10 MG TABS (LORATADINE) One tablet by mouth daily for allergies TESSALON PERLES 100 MG CAPS (BENZONATATE) One capsule by mouth two times a day as needed for cough Prescriptions: TESSALON PERLES 100 MG CAPS (BENZONATATE) One capsule by mouth two times a day as needed for cough  #30 x 0   Entered and Authorized by:   Lehman Prom FNP   Signed by:   Lehman Prom FNP on 02/18/2010   Method used:   Electronically to        [facility] [id number]* (retail)       [address] [place], [place]  [address]       Ph: [phone]       Fax: [phone]   RxID:   [id number] LORATADINE 10 MG TABS (LORATADINE) One tablet by mouth daily for allergies  #  30 x 5   Entered and Authorized by:   Lehman Prom FNP   Signed by:   Lehman Prom FNP on 02/18/2010   Method used:   Electronically to        [facility] [id number]* (retail)       [address] [place], [place]  [address]       Ph: [phone]       Fax: [phone]   RxID:   [id number]

## 2010-10-25 NOTE — Assessment & Plan Note (Signed)
Summary: HTN   Vital Signs:  Patient profile:   50 year old female Menstrual status:  2-3 years ago62 Weight:      257.8 pounds O2 Sat:      100 % on Room air Temp:     98.0 degrees F oral Pulse rate:   81 / minute Pulse rhythm:   regular Resp:     20 per minute BP sitting:   130 / 91  (left arm) Cuff size:   large  Vitals Entered By: Levon Hedger (March 02, 2010 10:08 AM)  O2 Flow:  Room air CC: pt wants to reschedule pap today and would like to get labs drawn and she has been having some leg cramps and wants to be tested for RA...still having some cough and phlem from sinus infection, Hypertension Management, Depression, Lipid Management Is Patient Diabetic? No Pain Assessment Patient in pain? no       Does patient need assistance? Functional Status Self care Ambulation Normal   CC:  pt wants to reschedule pap today and would like to get labs drawn and she has been having some leg cramps and wants to be tested for RA...still having some cough and phlem from sinus infection, Hypertension Management, Depression, and Lipid Management.  History of Present Illness:  Pt into the office as scheduled for a complete physical exam however she has her daughter with her today and would like to reschedule  Recent ER visit - started on antiobics as per urgent care and she has finished her antiobiotics. Pt is requesting another course of antibiotic today since some of her symptoms have continued. She was started on loratadine 10mg  and tessalon perles by this provider but pt did not get as ordered. Again advised pt that she needs to take allergy meds DAILY. Tobacco abuse continues.  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 C/o aches at time in legs to the point that it is difficult to ambulate  Depression Treatment History:  Prior Medication Used:   Start Date: Assessment of Effect:    Comments:  lexapro     --       --       takes sporaticall  Hypertension History:      She denies headache, chest pain, and palpitations.  She notes no problems with any antihypertensive medication side effects.  She is taking her blood pressure medication as ordered.        Positive major cardiovascular risk factors include hyperlipidemia, hypertension, and current tobacco user.  Negative major cardiovascular risk factors include female age less than 26 years old and no history of diabetes.        Further assessment for target organ damage reveals no history of ASHD, cardiac end-organ damage (CHF/LVH), stroke/TIA, peripheral vascular disease, renal insufficiency, or hypertensive retinopathy.    Lipid Management History:      Positive NCEP/ATP III risk factors include current tobacco user and hypertension.  Negative NCEP/ATP III risk factors include female age less than 67 years old, non-diabetic, HDL cholesterol greater than 60, no ASHD (atherosclerotic heart disease), no prior stroke/TIA, no peripheral vascular disease, and no history of aortic aneurysm.        The patient states that she knows about the "Therapeutic Lifestyle Change" diet.  Her compliance with the TLC diet is poor.  The patient does not know about adjunctive measures for cholesterol  lowering.  She expresses no side effects from her lipid-lowering medication.  Comments include: Pt takes medications very sporatically.  The patient denies any symptoms to suggest myopathy or liver disease.      Habits & Providers  Alcohol-Tobacco-Diet     Alcohol drinks/day: 0     Alcohol Counseling: to decrease amount and/or frequency of alcohol intake     Alcohol type: spirits     >5/day in last 3 mos: yes     Feels need to cut down: no     Feels annoyed by complaints: no     Feels guilty re: drinking: no     Needs 'eye opener' in am: no     Tobacco Status: current     Tobacco Counseling: to quit use of tobacco products     Cigarette  Packs/Day: 5 cigs/day     Year Started: 1992     Passive Smoke Exposure: no  Exercise-Depression-Behavior     Does Patient Exercise: no     Exercise Counseling: to improve exercise regimen     Type of exercise: walking     Times/week: 2     Drug Use: never     Seat Belt Use: 100     Sun Exposure: rarely  Allergies (verified): No Known Drug Allergies  Social History: Smoking Status:  current  Review of Systems General:  Denies loss of appetite. CV:  Denies chest pain or discomfort. Resp:  Denies cough. GI:  Denies abdominal pain, nausea, and vomiting. MS:  Complains of joint pain; bil lower extremities.  Physical Exam  General:  alert.  obese Head:  normocephalic.   Lungs:  normal breath sounds.   Heart:  normal rate and regular rhythm.   Abdomen:  normal bowel sounds.   Msk:  up to the exam table Neurologic:  alert & oriented X3.   Skin:  color normal.   Psych:  Oriented X3.     Impression & Recommendations:  Problem # 1:  HYPERTENSION, BENIGN ESSENTIAL (ICD-401.1) continue blood pressure meds low sodium diet Her updated medication list for this problem includes:    Amlodipine Besylate 10 Mg Tabs (Amlodipine besylate) .Marland Kitchen... Take one tablet by mouth daily for blood pressure    Hydrochlorothiazide 25 Mg Tabs (Hydrochlorothiazide) .Marland Kitchen... Take one tablet by mouth daily for blood pressure  Orders: Rapid HIV  ([address])  Problem # 2:  OBESITY (ICD-278.00) spoke with pt about the need to lose weight Orders: T-TSH ([address]253-66440) Mammogram (Screening) (Mammo)  Problem # 3:  PAIN IN JOINT, MULTIPLE SITES (ICD-719.49) will check RA today but advised pt that she will need to monitor her lifting and activities Orders: T-Rheumatoid Factor (34742-59563)  Problem # 4:  ALLERGIC RHINITIS (ICD-477.9)  The following medications were removed from the medication list:    Allegra 180 Mg Tabs (Fexofenadine hcl) .Marland Kitchen... Take 1 tablet by mouth once a day as needed Her updated  medication list for this problem includes:    Loratadine 10 Mg Tabs (Loratadine) ..... One tablet by mouth daily for allergies  Problem # 5:  ANXIETY DISORDER (ICD-300.00)  Her updated medication list for this problem includes:    Lexapro 10 Mg Tabs (Escitalopram oxalate) ..... One tablet by mouth daily for mood  Problem # 6:  HYPERCHOLESTEROLEMIA (ICD-272.0) will check labs today Her updated medication list for this problem includes:    Pravastatin Sodium 20 Mg Tabs (Pravastatin sodium) .Marland Kitchen... 1 tablet by mouth nightly for cholesterol  Orders: T-Lipid Profile ([id number]) T-Comprehensive Metabolic  Panel [phone]) T-CBC w/Diff [phone])  Complete Medication List: 1)  Lexapro 10 Mg Tabs (Escitalopram oxalate) .... One tablet by mouth daily for mood 2)  Amlodipine Besylate 10 Mg Tabs (Amlodipine besylate) .... Take one tablet by mouth daily for blood pressure 3)  Hydrochlorothiazide 25 Mg Tabs (Hydrochlorothiazide) .... Take one tablet by mouth daily for blood pressure 4)  Pravastatin Sodium 20 Mg Tabs (Pravastatin sodium) .Marland Kitchen.. 1 tablet by mouth nightly for cholesterol 5)  Loratadine 10 Mg Tabs (Loratadine) .... One tablet by mouth daily for allergies 6)  Tessalon Perles 100 Mg Caps (Benzonatate) .... One capsule by mouth two times a day as needed for cough  Hypertension Assessment/Plan:      The patient's hypertensive risk group is category B: At least one risk factor (excluding diabetes) with no target organ damage.  Her calculated 10 year risk of coronary heart disease is 6 %.  Today's blood pressure is 130/91.  Her blood pressure goal is < 140/90.  Lipid Assessment/Plan:      Based on NCEP/ATP III, the patient's risk factor category is "0-1 risk factors".  The patient's lipid goals are as follows: Total cholesterol goal is 200; LDL cholesterol goal is 160; HDL cholesterol goal is 40; Triglyceride goal is 150.     Patient Instructions: 1)  Reschedule a complete physical  exam.   2)  you will need PAP 3)  Labs done today and you will be notified of the results. 4)  Take mucinex twice per day for mucous production 5)  May take the tessalon perles in between if cough returns 6)  Drink plenty of water. 7)  Keep your appointment for a mammogram Prescriptions: PRAVASTATIN SODIUM 20 MG TABS (PRAVASTATIN SODIUM) 1 tablet by mouth nightly for cholesterol  #30 x 5   Entered and Authorized by:   Lehman Prom FNP   Signed by:   Lehman Prom FNP on 03/02/2010   Method used:   Faxed to ...       [facility] (retail)       [address] [place], [place]  [address]       Ph: [phone]       Fax: [phone]   RxID:   [id number] LEXAPRO 10 MG TABS (ESCITALOPRAM OXALATE) One tablet by mouth daily for mood  #30 x 5   Entered and Authorized by:   Lehman Prom FNP   Signed by:   Lehman Prom FNP on 03/02/2010   Method used:   Faxed to ...       [facility] (retail)       [address] [place], [place]  [address]       Ph: [phone]       Fax: [phone]   RxID:   [id number] HYDROCHLOROTHIAZIDE 25 MG TABS (HYDROCHLOROTHIAZIDE) Take one tablet by mouth daily for blood pressure  #30 x 5   Entered and Authorized by:   Lehman Prom FNP   Signed by:   Lehman Prom FNP on 03/02/2010   Method used:   Faxed to ...       [facility] - [facility] (retail)       [address] [place], [place]  [address]       Ph: [phone]       Fax: [phone]   RxID:   [id number]  AMLODIPINE BESYLATE 10 MG TABS (AMLODIPINE BESYLATE) Take one tablet by mouth daily for blood pressure  #30 x 5   Entered and Authorized by:   Lehman Prom FNP   Signed by:   Lehman Prom FNP on 03/02/2010   Method used:   Faxed to ...       [facility] - [facility] (retail)       [address] [place], [place]  [address]       Ph: [phone]       Fax: [phone]   RxID:   [id number]   Appended Document: HTN  Laboratory Results    Other Tests  Rapid HIV: negative

## 2010-10-25 NOTE — Letter (Signed)
Summary: Lipid Letter  [facility]  [address] [place], [place] [address]   Phone: [phone]  Fax: [phone]    03/03/2010  [name] [address] [place], [place]  [address]  Dear [name]:  We have carefully reviewed your last lipid profile from 03/02/2010 and the results are noted below with a summary of recommendations for lipid management.    Cholesterol:       220     Goal: less than 200   HDL "good" Cholesterol:   74     Goal: greater than 40   LDL "bad" Cholesterol:   137     Goal: less than 100   Triglycerides:       46     Goal: less than 150    Labs done during recent office visit ok except your cholesterol is still high. You need to take your cholesterol medication NIGHTLY. All other labs normal.    Current Medications: 1)    Lexapro 10 Mg Tabs (Escitalopram oxalate) .... One tablet by mouth daily for mood 2)    Amlodipine Besylate 10 Mg Tabs (Amlodipine besylate) .... Take one tablet by mouth daily for blood pressure 3)    Hydrochlorothiazide 25 Mg Tabs (Hydrochlorothiazide) .... Take one tablet by mouth daily for blood pressure 4)    Pravastatin Sodium 20 Mg Tabs (Pravastatin sodium) .Marland Kitchen.. 1 tablet by mouth nightly for cholesterol 5)    Loratadine 10 Mg Tabs (Loratadine) .... One tablet by mouth daily for allergies 6)    Tessalon Perles 100 Mg Caps (Benzonatate) .... One capsule by mouth two times a day as needed for cough  If you have any questions, please call. We appreciate being able to work with you.   Sincerely,    [facility] Lehman Prom FNP

## 2010-11-04 ENCOUNTER — Encounter: Payer: Self-pay | Admitting: Nurse Practitioner

## 2010-11-04 ENCOUNTER — Telehealth (INDEPENDENT_AMBULATORY_CARE_PROVIDER_SITE_OTHER): Payer: Self-pay | Admitting: Nurse Practitioner

## 2010-11-04 ENCOUNTER — Encounter (INDEPENDENT_AMBULATORY_CARE_PROVIDER_SITE_OTHER): Payer: Self-pay | Admitting: Nurse Practitioner

## 2010-11-10 NOTE — Assessment & Plan Note (Signed)
Summary: Sinusitis   Vital Signs:  Patient profile:   50 year old female Menstrual status:  2-3 years ago62 Weight:      263.5 pounds O2 Sat:      96 % on Room air Temp:     98.1 degrees F oral Pulse rate:   90 / minute Pulse rhythm:   regular Resp:     20 per minute BP sitting:   120 / 90  (left arm) Cuff size:   large  Vitals Entered By: Levon Hedger (November 04, 2010 12:34 PM)  O2 Flow:  Room air  Serial Vital Signs/Assessments:  Comments: 12:42 PM P/F 200,  300,  310 By: Levon Hedger   CC: x 3 days cough, head and chest congestion, feverish , URI symptoms, Depression, Lipid Management Is Patient Diabetic? No Pain Assessment Patient in pain? no       Does patient need assistance? Functional Status Self care Ambulation Normal   Primary Care Provider:  Lehman Prom FNP  CC:  x 3 days cough, head and chest congestion, feverish , URI symptoms, Depression, and Lipid Management.  History of Present Illness:  Pt into the office with f/u on URI       This is a 50 year old woman who presents with URI symptoms.  The symptoms began 5 days ago.  The severity is described as moderate.  The patient complains of sore throat and sick contacts, but denies nasal congestion.  Associated symptoms include fever of 100.5-103 degrees.  The patient denies wheezing.    Depression History:      The patient presents with symptoms of depression which have been present for greater than two weeks.  The patient denies a depressed mood most of the day and a diminished interest in her usual daily activities.  The patient denies recurrent thoughts of death or suicide.        Psychosocial stress factors include major life changes.  The patient denies that she feels like life is not worth living, denies that she wishes that she were dead, and denies that she has thought about ending her life.        Comments:  pt is still taking lexapro.  Depression Treatment History:  Prior  Medication Used:   Start Date: Assessment of Effect:   Comments:  lexapro     --     much improvement     taking as ordered  Lipid Management History:      Positive NCEP/ATP III risk factors include current tobacco user and hypertension.  Negative NCEP/ATP III risk factors include female age less than 21 years old, non-diabetic, HDL cholesterol greater than 60, no ASHD (atherosclerotic heart disease), no prior stroke/TIA, no peripheral vascular disease, and no history of aortic aneurysm.        The patient states that she knows about the "Therapeutic Lifestyle Change" diet.  Her compliance with the TLC diet is poor.  The patient does not know about adjunctive measures for cholesterol lowering.  She expresses no side effects from her lipid-lowering medication.  The patient denies any symptoms to suggest myopathy or liver disease.  Comments: pt is not fasting today - unable to check labs.    Habits & Providers  Alcohol-Tobacco-Diet     Alcohol drinks/day: <1     Alcohol Counseling: not indicated; use of alcohol is not excessive or problematic     Alcohol type: spirits     >5/day in last 3 mos: no  Feels need to cut down: no     Feels annoyed by complaints: no     Feels guilty re: drinking: no     Needs 'eye opener' in am: no     Tobacco Status: current     Tobacco Counseling: to quit use of tobacco products     Cigarette Packs/Day: 5 cigs/day     Year Started: 1992     Passive Smoke Exposure: no  Exercise-Depression-Behavior     Does Patient Exercise: no     Exercise Counseling: to improve exercise regimen     Type of exercise: walking     Times/week: 2     Drug Use: never     Seat Belt Use: 100     Sun Exposure: rarely  Allergies (verified): No Known Drug Allergies  Review of Systems General:  Complains of fever. ENT:  Complains of nasal congestion and sore throat; denies difficulty swallowing and hoarseness. CV:  Denies chest pain or discomfort. Resp:  Complains of  cough; denies shortness of breath.  Physical Exam  General:  alert.   Head:  normocephalic.   Ears:  ear piercing(s) noted.   bil TM with clear fluid Lungs:  normal breath sounds.   Heart:  normal rate and regular rhythm.   Abdomen:  normal bowel sounds.   Msk:  normal ROM.   Neurologic:  alert & oriented X3.   Psych:  Oriented X3.     Impression & Recommendations:  Problem # 1:  SINUSITIS (ICD-473.9)  The following medications were removed from the medication list:    Tessalon Perles 100 Mg Caps (Benzonatate) ..... One capsule by mouth two times a day as needed for cough Her updated medication list for this problem includes:    Azithromycin 250 Mg Tabs (Azithromycin) .Marland Kitchen... 2 tablets by mouth on day one then one tablet by mouth daily    Promethazine-codeine 6.25-10 Mg/67ml Syrp (Promethazine-codeine) .Marland Kitchen... 2 teaspoons by mouth nightly as needed for cough  Orders: Peak Flow Rate (94150) Pulse Oximetry (single measurment) (94760)  Problem # 2:  TOBACCO ABUSE (ICD-305.1) advised cessation  Problem # 3:  HYPERTENSION, BENIGN ESSENTIAL (ICD-401.1)  Her updated medication list for this problem includes:    Amlodipine Besylate 10 Mg Tabs (Amlodipine besylate) ..... One tablet by mouth daily for blood pressure    Hydrochlorothiazide 25 Mg Tabs (Hydrochlorothiazide) .Marland Kitchen... Take one tablet by mouth daily for blood pressure  Complete Medication List: 1)  Lexapro 10 Mg Tabs (Escitalopram oxalate) .... One tablet by mouth daily for mood 2)  Amlodipine Besylate 10 Mg Tabs (Amlodipine besylate) .... One tablet by mouth daily for blood pressure 3)  Hydrochlorothiazide 25 Mg Tabs (Hydrochlorothiazide) .... Take one tablet by mouth daily for blood pressure 4)  Pravastatin Sodium 20 Mg Tabs (Pravastatin sodium) .Marland Kitchen.. 1 tablet by mouth nightly for cholesterol 5)  Azithromycin 250 Mg Tabs (Azithromycin) .... 2 tablets by mouth on day one then one tablet by mouth daily 6)  Promethazine-codeine  6.25-10 Mg/58ml Syrp (Promethazine-codeine) .... 2 teaspoons by mouth nightly as needed for cough  Lipid Assessment/Plan:      Based on NCEP/ATP III, the patient's risk factor category is "0-1 risk factors".  The patient's lipid goals are as follows: Total cholesterol goal is 200; LDL cholesterol goal is 160; HDL cholesterol goal is 40; Triglyceride goal is 150.    Patient Instructions: 1)  You should keep up your efforts to quit smoking. 2)    3)  Take the z-pack  as ordered 4)  You should take the cough meds as needed because it will make her sleepy. 5)  Follow up as needed. Prescriptions: PROMETHAZINE-CODEINE 6.25-10 MG/5ML SYRP (PROMETHAZINE-CODEINE) 2 teaspoons by mouth nightly as needed for cough  #4 ounce x 0   Entered and Authorized by:   Lehman Prom FNP   Signed by:   Lehman Prom FNP on 11/04/2010   Method used:   Print then Give to Patient   RxID:   [id number] AZITHROMYCIN 250 MG TABS (AZITHROMYCIN) 2 tablets by mouth on day one then one tablet by mouth daily  #6 x 0   Entered and Authorized by:   Lehman Prom FNP   Signed by:   Lehman Prom FNP on 11/04/2010   Method used:   Faxed to ...       [facility] (retail)       [address] [place], [place]  [address]       Ph: [phone]       Fax: [phone]   RxID:   [id number] AZITHROMYCIN 250 MG TABS (AZITHROMYCIN) 2 tablets by mouth on day one then one tablet by mouth daily  #6 x 0   Entered and Authorized by:   Lehman Prom FNP   Signed by:   Lehman Prom FNP on 11/04/2010   Method used:   Print then Give to Patient   RxID:   [id number]    Orders Added: 1)  Est. Patient Level III [73220] 2)  Peak Flow Rate [94150] 3)  Pulse Oximetry (single measurment) [25427]

## 2010-11-10 NOTE — Progress Notes (Signed)
Summary: Respiratory symptoms  Phone Note Call from Patient   Summary of Call: Sister calling for pt.  -- very concerned.  Pt. has expiratory wheezing, stuffy nose, sneezing, coughing thick yellow mucus -- thinks she has pneumonia.   Feels like she has a  headache, has pressure.  Taking BC powders and Benadryl, certirizine, Mucinex, Robitussin, not effective.   Was running a fever yesterday, not today.  Nasal and chest congestion.  Draining clear drainage from nose.  SOB when moving around, coughs when she's talking, not at night.  Denies CP.  Appt. scheduled today with provider. Initial call taken by: Dutch Quint RN,  November 04, 2010 10:52 AM  Follow-up for Phone Call        Pt. in office.  Dutch Quint RN  November 04, 2010 12:14 PM

## 2010-11-18 ENCOUNTER — Inpatient Hospital Stay (INDEPENDENT_AMBULATORY_CARE_PROVIDER_SITE_OTHER)
Admission: RE | Admit: 2010-11-18 | Discharge: 2010-11-18 | Disposition: A | Discharge status: Home / Self Care | Payer: Self-pay | Source: Ambulatory Visit | Attending: Emergency Medicine | Admitting: Emergency Medicine

## 2010-11-18 ENCOUNTER — Ambulatory Visit (INDEPENDENT_AMBULATORY_CARE_PROVIDER_SITE_OTHER): Payer: Self-pay

## 2010-11-18 DIAGNOSIS — M1990 Unspecified osteoarthritis, unspecified site: Secondary | ICD-10-CM

## 2010-11-18 LAB — D-DIMER, QUANTITATIVE: D-Dimer, Quant: 0.39 ug/mL-FEU (ref 0.00–0.48)

## 2010-11-22 ENCOUNTER — Telehealth (INDEPENDENT_AMBULATORY_CARE_PROVIDER_SITE_OTHER): Payer: Self-pay | Admitting: Nurse Practitioner

## 2010-11-22 DIAGNOSIS — M1610 Unilateral primary osteoarthritis, unspecified hip: Secondary | ICD-10-CM | POA: Insufficient documentation

## 2010-11-24 ENCOUNTER — Encounter (INDEPENDENT_AMBULATORY_CARE_PROVIDER_SITE_OTHER): Payer: Self-pay | Admitting: Nurse Practitioner

## 2010-11-29 ENCOUNTER — Encounter: Payer: Self-pay | Admitting: Nurse Practitioner

## 2010-11-29 ENCOUNTER — Encounter (INDEPENDENT_AMBULATORY_CARE_PROVIDER_SITE_OTHER): Payer: Self-pay | Admitting: Nurse Practitioner

## 2010-12-01 NOTE — Medication Information (Signed)
Summary: CONNECTION TO CARE//TO PHARMACY  CONNECTION TO CARE//TO PHARMACY   Imported By: Arta Bruce 11/24/2010 09:39:43  _____________________________________________________________________  External Attachment:    Type:   Image     Comment:   External Document

## 2010-12-06 NOTE — Assessment & Plan Note (Signed)
Summary: X-ray results   Vital Signs:  Patient profile:   50 year old female Menstrual status:  2-3 years ago62 Weight:      263.1 pounds BMI:     48.30 Temp:     98.1 degrees F oral Pulse rate:   82 / minute Pulse rhythm:   regular Resp:     20 per minute BP sitting:   137 / 85  (left arm) Cuff size:   large  Vitals Entered By: Levon Hedger (November 29, 2010 8:58 AM)  Nutrition Counseling: Patient's BMI is greater than 25 and therefore counseled on weight management options. CC: f/u results for x-rays...Marland Kitchenneeds sinus medication, Hypertension Management, Lipid Management Is Patient Diabetic? No Pain Assessment Patient in pain? no       Does patient need assistance? Functional Status Self care Ambulation Normal   Primary Care Provider:  Lehman Prom FNP  CC:  f/u results for x-rays...Marland Kitchenneeds sinus medication, Hypertension Management, and Lipid Management.  History of Present Illness:  Pt the office for follow up on ER visit for hip pain. X-rays done which showed that she does have some arthritis in her hips.  R>L Some limitations with activities Stiffness if more in the morning, gets some better as the day improves  Hypertension History:      She denies headache, chest pain, and palpitations.  She notes no problems with any antihypertensive medication side effects.        Positive major cardiovascular risk factors include hyperlipidemia, hypertension, and current tobacco user.  Negative major cardiovascular risk factors include female age less than 48 years old and no history of diabetes.        Further assessment for target organ damage reveals no history of ASHD, cardiac end-organ damage (CHF/LVH), stroke/TIA, peripheral vascular disease, renal insufficiency, or hypertensive retinopathy.    Lipid Management History:      Positive NCEP/ATP III risk factors include current tobacco user and hypertension.  Negative NCEP/ATP III risk factors include female age less than  35 years old, non-diabetic, HDL cholesterol greater than 60, no ASHD (atherosclerotic heart disease), no prior stroke/TIA, no peripheral vascular disease, and no history of aortic aneurysm.        The patient states that she knows about the "Therapeutic Lifestyle Change" diet.  Her compliance with the TLC diet is poor.  She expresses no side effects from her lipid-lowering medication.  The patient denies any symptoms to suggest myopathy or liver disease.      Habits & Providers  Alcohol-Tobacco-Diet     Alcohol drinks/day: <1     Alcohol Counseling: not indicated; use of alcohol is not excessive or problematic     Alcohol type: spirits     >5/day in last 3 mos: no     Feels need to cut down: no     Feels annoyed by complaints: no     Feels guilty re: drinking: no     Needs 'eye opener' in am: no     Tobacco Status: current     Tobacco Counseling: to quit use of tobacco products     Cigarette Packs/Day: 5 cigs/day     Year Started: 1992     Passive Smoke Exposure: no  Exercise-Depression-Behavior     Does Patient Exercise: no     Exercise Counseling: to improve exercise regimen     Type of exercise: walking     Times/week: 2     Drug Use: never     Seat  Belt Use: 100     Sun Exposure: rarely  Comments: Pt has a [employer] membership but she has not been in the past 3-4 weeks due to pain in the hip and groin.  She has a friend who is also supportive and going to the gym  Allergies (verified): No Known Drug Allergies  Review of Systems General:  Denies fever. CV:  Denies chest pain or discomfort. Resp:  Denies cough. GI:  Denies abdominal pain, nausea, and vomiting. MS:  Complains of joint pain.  Physical Exam  General:  alert and overweight-appearing.   Head:  normocephalic.   Ears:  bil TM with clear fluid Nose:  inflammed turbinates Lungs:  normal breath sounds.   Heart:  normal rate and regular rhythm.   Neurologic:  alert & oriented X3.     Hip  Exam  General:    obese.    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 One tablet by mouth daily for blood pressure    Hydrochlorothiazide 25 Mg Tabs (Hydrochlorothiazide) .Marland Kitchen... Take one tablet by mouth daily for blood pressure  Problem # 3:  ALLERGIC RHINITIS (ICD-477.9)  Her updated medication list for this problem includes:    Rhinocort Aqua 32 Mcg/act Susp (Budesonide) ..... One spray in each nostril two times a day  Problem # 4:  OBESITY (ICD-278.00) advised pt that she needs to lose weight and she is committed to going back to the gym and working on this goal  Complete Medication List: 1)  Lexapro 10 Mg Tabs (Escitalopram oxalate) .... One tablet by mouth daily for mood 2)  Amlodipine Besylate 10 Mg Tabs (Amlodipine besylate) .... One tablet by mouth daily for blood pressure 3)  Hydrochlorothiazide 25 Mg Tabs (Hydrochlorothiazide) .... Take one tablet by mouth daily for blood pressure 4)  Pravastatin Sodium 20 Mg Tabs (Pravastatin sodium) .Marland Kitchen.. 1 tablet by mouth nightly for cholesterol 5)  Ra Glucosamine-chondroitin 750-600 Mg Tabs (Glucosamine-chondroitin) .... One tablet by mouth two times a day for joints 6)  Indomethacin Cr 75 Mg Cr-caps (Indomethacin) .... One tablet by mouth daily for joints **take with food** 7)  Rhinocort Aqua 32  Mcg/act Susp (Budesonide) .... One spray in each nostril two times a day  Hypertension Assessment/Plan:      The patient's hypertensive risk group is category B: At least one risk factor (excluding diabetes) with no target organ damage.  Her calculated 10 year risk of coronary heart disease is 4 %.  Today's blood pressure is 137/85.  Her blood pressure goal is < 140/90.  Lipid Assessment/Plan:      Based on NCEP/ATP III, the patient's risk factor category is "0-1 risk factors".  The patient's  lipid goals are as follows: Total cholesterol goal is 200; LDL cholesterol goal is 160; HDL cholesterol goal is 40; Triglyceride goal is 150.    Patient Instructions: 1)  Follow up in 4 weeks for hip pain. 2)  Goal until your next visit -  3)  start going to the gym 4)  Work on your wieght loss 5)  Start vitamins - see below. 6)  Blood pressure - doing well.  Keep up the good work 7)  Allergies - use nasal spray rhinocort; hold head down and use twice per day.  No need for antibiotics. 8)  You need to STOP smoking Prescriptions: RHINOCORT AQUA 32 MCG/ACT SUSP (BUDESONIDE) One spray in each nostril two times a day  #1 x 5   Entered and Authorized by:   Lehman Prom FNP   Signed by:   Lehman Prom FNP on 11/29/2010   Method used:   Print then Give to Patient   RxID:   [id number] INDOMETHACIN CR 75 MG CR-CAPS (INDOMETHACIN) One tablet by mouth daily for joints **take with food**  #90 x 1   Entered and Authorized by:   Lehman Prom FNP   Signed by:   Lehman Prom FNP on 11/29/2010   Method used:   Print then Give to Patient   RxID:   [id number]    Orders Added: 1)  Est. Patient Level III [84696]

## 2010-12-06 NOTE — Progress Notes (Signed)
Summary: requesting ortho referral  Phone Note Call from Patient   Caller: Patient Summary of Call: Pt. seen @ Urgent Care and was told she needs to see an orthopedic specialist for her arthritis. Would like referral to [facility] due to financial issues. Urgent care record pulled for Jesse Fall to review placed on her desk. Initial call taken by: Gaylyn Cheers RN,  November 22, 2010 10:50 AM  Follow-up for Phone Call        reviewed pt's urgent care visit she does have arthritis in her hips R>L (moderate) but this is not enough to get referral to [facility]. They take extreme cases of ortho problems.   What she has can be maintained with weight loss (this is key), starting an exercise routine and anti-inflammatories.  The combination of the 3 will provide some relief.   She can be referred locally to ortho but there is a $250 co-payment Follow-up by: Lehman Prom FNP,  November 23, 2010 12:52 PM  Additional Follow-up for Phone Call Additional follow up Details #1::        Left message on answering machine for pt. to return call.  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 consider outpt pelvic ultrasound to confirm

## 2010-12-06 NOTE — Letter (Signed)
Summary: Handout Printed  Printed Handout:  - Arthritis - Degenerative, Brief 

## 2011-02-10 NOTE — Discharge Summary (Signed)
NAMEARDYCE, HEYER             ACCOUNT NO.:  [id number]   MEDICAL RECORD NO.:  [id number]          PATIENT TYPE:  INP   LOCATION:  9128                          FACILITY:  [facility]   PHYSICIAN:  Freddy Finner, M.D.   DATE OF BIRTH:  [birth year]   DATE OF ADMISSION:  07/12/2004  DATE OF DISCHARGE:  07/17/2004                                 DISCHARGE SUMMARY   ADMITTING DIAGNOSES:  1.  Intrauterine pregnancy at 39 and three-sevenths weeks estimated      gestational age.  2.  Induction of labor secondary to macrosomia.  3.  Gestational diabetes, diet control.   DISCHARGE DIAGNOSES:  1.  Status post low transverse cesarean section secondary to failed      induction.  2.  Viable female infant.  3.  Postpartum hemorrhage.   PROCEDURE:  Primary low transverse cesarean section.   REASON FOR ADMISSION:  Please see dictated H&P.   HOSPITAL COURSE:  The patient was a 50 year old primigravida female that  presented to [facility] for induction of labor.  The  patient had history of gestational diabetes which had been under good  control with diet.  The patient had had an ultrasound in the office which  had estimated fetal weight of 4000 g.  The patient also was noted to have  positive group B beta strep and she was administered IV antibiotics  prophylactically.  On admission, the patient's cervix was noted to be closed  and long, vertex presentation.  Fetal heart tones were reactive in the 140s  and the patient was having some irregular contractions.  Pitocin was started  to augment her labor and after approximately 4 hours without further changes  in cervix, decision was made to proceed with a cesarean delivery.  The  patient was then transferred to the operating room where spinal anesthesia  was administered without difficulty.  A low transverse incision was made  with the delivery of a viable female infant weighing 8 pounds 0 ounces with  Apgars of 8 at one minute and  9 at five minutes.  Arterial cord pH was 7.11.  The patient tolerated the procedure well and was taken to the recovery room  in stable condition.  On postoperative day #1, vital signs were stable, she  was afebrile.  Abdomen was soft with good return of bowel function.  Fundus  was firm and nontender.  Abdominal dressing was noted to have a small amount  of drainage noted on bandage.  The patient was ambulating well.  Labs  revealed hemoglobin of 10.2, platelet count of 212, wbc count of 10.9.  On  postoperative day #2, the patient was without complaint.  Vital signs were  stable, she was afebrile.  Abdomen was soft, fundus firm.  Incision was  clean, dry, and intact.  She was ambulating well.  Later that morning, the  patient did sustain a heavy bleeding episode.  Blood pressure was 125/90,  pulse was 103.  She was alert and oriented.  Fundus was firm at the present.  Incision was clean, dry, and intact.  The patient was  started on some IV  Pitocin and was given Methergine IM.  On the following day, the patient was  without complaint.  Vital signs were stable.  Hemoglobin was stable at 10.0.  Fundus continued to be firm.  Incision was clean, dry, and intact.  On  postoperative day #4, the patient was without complaint.  Vital signs were  stable, she was afebrile.  Hemoglobin was stable at 9.9.  Fundus was firm  and nontender.  Incision was clean, dry, and intact.  Staples were removed  and the patient was discharged home.   CONDITION ON DISCHARGE:  Good.   DIET:  Regular as tolerated.   ACTIVITY:  No heavy lifting, no driving x2 weeks, no vaginal entry.   FOLLOW-UP:  The patient is to follow up in the office in 1 week for an  incision check.  She is to call for temperature greater than 100 degrees,  persistent nausea and vomiting, heavy vaginal bleeding, and/or redness or  drainage from the incisional site.   DISCHARGE MEDICATIONS:  1.  Tylox #30 one p.o. q.4-6h. p.r.n.  2.  Motrin  600 mg q.6h.  3.  Prenatal vitamins one p.o. daily.  4.  Colace one p.o. daily p.r.n.     Catalina Gravel   CC/MEDQ  D:  08/11/2004  T:  08/11/2004  Job:  [id number]

## 2011-02-10 NOTE — H&P (Signed)
NAMEGreggory Keen              ACCOUNT NO.:  [id number]   MEDICAL RECORD NO.:  [id number]           PATIENT TYPE:   LOCATION:                                 FACILITY:   PHYSICIAN:  Dineen Kid. Rana Snare, M.D.    DATE OF BIRTH:  August 24, 1961   DATE OF ADMISSION:  07/12/2004  DATE OF DISCHARGE:                                HISTORY & PHYSICAL   HISTORY OF PRESENT ILLNESS:  Ms. [name] is a 50 year old, G2, P0, A1 at 34  3/7 weeks who presented to the office today for an obstetrical evaluation.  She is a gestational diabetic diet controlled. She is placed on the external  fetal monitoring for fetal surveillance and found to have a nonreactive NST  and she was sent to triage for further evaluation and received a biophysical  of 8/8 and had an ultrasound which shows estimated fetal weight just over  4000 g. Her fasting blood sugar today was 95. She presents for induction of  labor, her [facility] is July 16, 2004, she is group B strep positive. She does  have a history of uterine fibroids. Pregnancy was also complicated by  advanced maternal age and had a first trimester screening that reduced her  risk to 1/107 and she was satisfied with this and declined her  amniocentesis.  She furthermore had an abnormal quad screen which had post  test results of 1/15. She declined the amniocentesis and had a normal level  2 ultrasound.   PAST MEDICAL HISTORY:  Negative.   PAST SURGICAL HISTORY:  Negative.   PAST OB HISTORY:  Significant for termination of pregnancy at age 63.   MEDICATIONS:  Prenatal vitamins, she has no known drug allergies.   Blood type is O positive. She is rubella immune, hepatitis B negative and  RPR nonreactive.   PHYSICAL EXAMINATION:  Weight is 264, blood pressure is 122/80, urinalysis  shows trace protein. Fasting blood sugar is 95.  Fundal height is 39 cm,  cervix is long and closed.   IMPRESSION:  Intrauterine pregnancy at 30 1/2 weeks with gestational  diabetes diet  controlled and macrosomia.   PLAN:  Two stage induction of labor with Cytotec cervical ripening followed  by Pitocin and amniotomy. The patient understands that she is at increased  risk for cesarean section due to diabetes and macrosomia. Will place her on  IV antibiotics during active labor or with spontaneous rupture of membranes  and anticipate vaginal delivery.      DCL/MEDQ  D:  07/12/2004  T:  07/12/2004  Job:  [id number]

## 2011-02-10 NOTE — Op Note (Signed)
[name], [name] NO.:  [id number]   MEDICAL RECORD NO.:  [id number]          PATIENT TYPE:  [facility]   LOCATION:  [facility]                          FACILITY:  [facility]   PHYSICIAN:  Juluis Mire, M.D.   DATE OF BIRTH:  Jan 28, 1961   DATE OF PROCEDURE:  07/12/2004  DATE OF DISCHARGE:                                 OPERATIVE REPORT   PREOPERATIVE DIAGNOSES:  1.  Intrauterine pregnancy at term complicated by gestational diabetes with      further control.  2.  Failed induction with desire to proceed with primary cesarean section.   POSTOPERATIVE DIAGNOSES:  1.  Intrauterine pregnancy at term complicated by gestational diabetes with      further control.  2.  Failed induction with desire to proceed with primary cesarean section.   OPERATION:  Low transverse cesarean section.   SURGEON:  Juluis Mire, M.D.   ANESTHESIA:  Spinal.   ESTIMATED BLOOD LOSS:  500 mL.   PACKS/DRAINS:  None.   INTRAOPERATIVE BLOOD REPLACED:  None.   COMPLICATIONS:  None.   INDICATIONS FOR PROCEDURE:  The patient is a 50 year old, primigravida  female who presents at 31 plus weeks.  She had a history of gestational  diabetes. She was scheduled for induction. Estimated fetal weight of 4000 g.  The most positive group  B strep received IV penicillin G.  She was on  Pitocin for approximately four hours with no progressive changes. She  declined any further attempt at induction at that point.  Pitocin was  discontinued per patient's request. The cervix remained unchanged and was  long and closed. The patient desired primary cesarean section for which she  comes back the present time.  The risks were discussed including the risk of  infection. The risk of hemorrhage that could require transfusion, risk of  AIDS or hepatitis, risk of injury to adjacent organs requiring repeat  exploratory surgery. Risk of deep venous thrombosis and pulmonary embolus.   DESCRIPTION OF PROCEDURE:  The  patient was taken to the OR and placed in the  supine position with a left lateral tilt. After a second level of spinal  anesthesia was obtained, the abdomen was prepped out with Betadine and  draped in a sterile field. The patient did suffer a total spinal. Had to  have a assisted respiration. At this point in time, a low transverse skin  incision was made with the knife, carried through the subcutaneous tissue,  fascia was entered sharply, incision in the fascia extended laterally. The  fascia was taken down off the muscle superiorly and inferiorly. The rectus  muscles were separated in the midline. The peritoneum was entered sharply  and incision in the peritoneum extended both superiorly and inferiorly. A  low transverse bladder flap was developed. A low transverse uterine incision  was begun with the knife and extended laterally using manual traction. The  infant presented in the vertex presentation, __________ fundal pressure. The  infant was a viable female who weighed 8 pounds. Apgar's were 8 & 9,  umbilical cord pH was 7.11.  Amniotic fluid was  clear. The placenta was  delivered manually and sent for pathological review.  The uterus was  exteriorized for closure. Multiple uterine fibroids were noted. She had two  large fundal fibroids measuring approximately 6-7 cm in greatest dimension.  The tubes and ovaries were otherwise unremarkable. The uterus was closed  with running locking suture of #0 chromic using a two layer closure  technique. Hemostasis was excellent. The uterus was returned to the  abdominal cavity, we thoroughly irrigated the pelvis, urine output was clear  and adequate. The muscles were reapproximated with running suture of 3-0  Vicryl, fascia closed with running suture of #0 PDS. The skin was closed  with staples and Steri-Strips. Sponge, instrument and needle count reported  as correct by circulating nurse x2.  Foley catheter remained clear at the  time of  closure. The patient tolerated the procedure well and was returned  to the recovery room in good condition.      JSM/MEDQ  D:  07/13/2004  T:  07/13/2004  Job:  [id number]

## 2011-04-27 ENCOUNTER — Inpatient Hospital Stay (INDEPENDENT_AMBULATORY_CARE_PROVIDER_SITE_OTHER)
Admission: RE | Admit: 2011-04-27 | Discharge: 2011-04-27 | Disposition: A | Discharge status: Home / Self Care | Payer: Medicaid Other | Source: Ambulatory Visit | Attending: Emergency Medicine | Admitting: Emergency Medicine

## 2011-04-27 DIAGNOSIS — IMO0002 Reserved for concepts with insufficient information to code with codable children: Secondary | ICD-10-CM

## 2011-06-22 ENCOUNTER — Encounter: Payer: Self-pay | Admitting: Gastroenterology

## 2011-07-21 ENCOUNTER — Other Ambulatory Visit: Payer: Medicaid Other | Admitting: Gastroenterology

## 2011-08-02 ENCOUNTER — Ambulatory Visit (AMBULATORY_SURGERY_CENTER): Payer: Medicaid Other | Admitting: *Deleted

## 2011-08-02 VITALS — Ht 62.0 in | Wt 265.7 lb

## 2011-08-02 DIAGNOSIS — Z1211 Encounter for screening for malignant neoplasm of colon: Secondary | ICD-10-CM

## 2011-08-02 MED ORDER — MOVIPREP 100 G PO SOLR: ORAL | Status: DC

## 2011-08-16 ENCOUNTER — Other Ambulatory Visit: Payer: Medicaid Other | Admitting: Gastroenterology

## 2011-08-21 ENCOUNTER — Other Ambulatory Visit: Payer: Medicaid Other | Admitting: Gastroenterology

## 2011-08-24 ENCOUNTER — Ambulatory Visit: Payer: Medicaid Other | Attending: Physician Assistant

## 2011-08-24 DIAGNOSIS — R262 Difficulty in walking, not elsewhere classified: Secondary | ICD-10-CM | POA: Insufficient documentation

## 2011-08-24 DIAGNOSIS — R5381 Other malaise: Secondary | ICD-10-CM | POA: Insufficient documentation

## 2011-08-24 DIAGNOSIS — M2560 Stiffness of unspecified joint, not elsewhere classified: Secondary | ICD-10-CM | POA: Insufficient documentation

## 2011-08-24 DIAGNOSIS — IMO0001 Reserved for inherently not codable concepts without codable children: Secondary | ICD-10-CM | POA: Insufficient documentation

## 2011-08-24 DIAGNOSIS — M2550 Pain in unspecified joint: Secondary | ICD-10-CM | POA: Insufficient documentation

## 2011-08-29 ENCOUNTER — Telehealth: Payer: Self-pay | Admitting: Gastroenterology

## 2011-08-29 NOTE — Telephone Encounter (Signed)
Patient had a question regarding her Moviprep.  She wanted to be sure how to mix it up and what times to drink it.  I advised her of the directions and she understood.

## 2011-08-30 ENCOUNTER — Encounter: Payer: Self-pay | Admitting: Gastroenterology

## 2011-08-30 ENCOUNTER — Ambulatory Visit (AMBULATORY_SURGERY_CENTER): Payer: Medicaid Other | Admitting: Gastroenterology

## 2011-08-30 VITALS — BP 129/90 | HR 95 | Temp 98.8°F | Resp 30 | Ht 62.0 in | Wt 265.0 lb

## 2011-08-30 DIAGNOSIS — Z1211 Encounter for screening for malignant neoplasm of colon: Secondary | ICD-10-CM

## 2011-08-30 MED ORDER — SODIUM CHLORIDE 0.9 % IV SOLN: 500.0000 mL | INTRAVENOUS | Status: DC

## 2011-08-30 NOTE — Progress Notes (Signed)
Pt anxious pre-procedure.  Pt was very talkative.  MAW  Pt tolerated the colonoscopy very well. Maw  Propofol administered by Shon Hough, CRNA. maw

## 2011-08-30 NOTE — Progress Notes (Signed)
Patient did not experience any of the following events: a burn prior to discharge; a fall within the facility; wrong site/side/patient/procedure/implant event; or a hospital transfer or hospital admission upon discharge from the facility. (G8907) Patient did not have preoperative order for IV antibiotic SSI prophylaxis. (G8918)  

## 2011-08-30 NOTE — Patient Instructions (Signed)
NORMAL COLON  SEE BLUE AND GREEN SHEETS FOR ADDITIONAL D/C INSTRUCTIONS

## 2011-08-31 ENCOUNTER — Telehealth: Payer: Self-pay | Admitting: *Deleted

## 2011-08-31 NOTE — Telephone Encounter (Signed)

## 2011-09-07 ENCOUNTER — Ambulatory Visit: Payer: Medicaid Other

## 2011-09-11 ENCOUNTER — Ambulatory Visit: Payer: Medicaid Other | Attending: Physician Assistant

## 2011-09-11 DIAGNOSIS — IMO0001 Reserved for inherently not codable concepts without codable children: Secondary | ICD-10-CM | POA: Insufficient documentation

## 2011-09-11 DIAGNOSIS — R5381 Other malaise: Secondary | ICD-10-CM | POA: Insufficient documentation

## 2011-09-11 DIAGNOSIS — M2550 Pain in unspecified joint: Secondary | ICD-10-CM | POA: Insufficient documentation

## 2011-09-11 DIAGNOSIS — M2560 Stiffness of unspecified joint, not elsewhere classified: Secondary | ICD-10-CM | POA: Insufficient documentation

## 2011-09-11 DIAGNOSIS — R262 Difficulty in walking, not elsewhere classified: Secondary | ICD-10-CM | POA: Insufficient documentation

## 2011-09-28 ENCOUNTER — Ambulatory Visit: Payer: Medicaid Other

## 2012-07-11 ENCOUNTER — Encounter: Payer: Self-pay | Admitting: Physical Medicine & Rehabilitation

## 2012-08-09 ENCOUNTER — Encounter: Payer: Medicaid Other | Attending: Physical Medicine & Rehabilitation | Admitting: Physical Medicine & Rehabilitation

## 2012-08-09 ENCOUNTER — Encounter: Payer: Self-pay | Admitting: Physical Medicine & Rehabilitation

## 2012-08-09 VITALS — BP 122/77 | HR 90 | Resp 14 | Ht 62.0 in | Wt 252.0 lb

## 2012-08-09 DIAGNOSIS — IMO0002 Reserved for concepts with insufficient information to code with codable children: Secondary | ICD-10-CM | POA: Insufficient documentation

## 2012-08-09 DIAGNOSIS — M549 Dorsalgia, unspecified: Secondary | ICD-10-CM

## 2012-08-09 DIAGNOSIS — M1611 Unilateral primary osteoarthritis, right hip: Secondary | ICD-10-CM | POA: Insufficient documentation

## 2012-08-09 DIAGNOSIS — M1710 Unilateral primary osteoarthritis, unspecified knee: Secondary | ICD-10-CM | POA: Insufficient documentation

## 2012-08-09 DIAGNOSIS — E669 Obesity, unspecified: Secondary | ICD-10-CM

## 2012-08-09 DIAGNOSIS — M1610 Unilateral primary osteoarthritis, unspecified hip: Secondary | ICD-10-CM | POA: Insufficient documentation

## 2012-08-09 DIAGNOSIS — F329 Major depressive disorder, single episode, unspecified: Secondary | ICD-10-CM | POA: Insufficient documentation

## 2012-08-09 DIAGNOSIS — M25519 Pain in unspecified shoulder: Secondary | ICD-10-CM

## 2012-08-09 DIAGNOSIS — F3289 Other specified depressive episodes: Secondary | ICD-10-CM | POA: Insufficient documentation

## 2012-08-09 DIAGNOSIS — F419 Anxiety disorder, unspecified: Secondary | ICD-10-CM | POA: Insufficient documentation

## 2012-08-09 DIAGNOSIS — X58XXXA Exposure to other specified factors, initial encounter: Secondary | ICD-10-CM | POA: Insufficient documentation

## 2012-08-09 DIAGNOSIS — M170 Bilateral primary osteoarthritis of knee: Secondary | ICD-10-CM

## 2012-08-09 DIAGNOSIS — M169 Osteoarthritis of hip, unspecified: Secondary | ICD-10-CM

## 2012-08-09 DIAGNOSIS — M160 Bilateral primary osteoarthritis of hip: Secondary | ICD-10-CM

## 2012-08-09 DIAGNOSIS — F341 Dysthymic disorder: Secondary | ICD-10-CM

## 2012-08-09 DIAGNOSIS — Z5181 Encounter for therapeutic drug level monitoring: Secondary | ICD-10-CM

## 2012-08-09 MED ORDER — METHOCARBAMOL 500 MG PO TABS: 500.0000 mg | ORAL_TABLET | Freq: Four times a day (QID) | ORAL | Status: DC | PRN

## 2012-08-09 MED ORDER — VENLAFAXINE HCL 75 MG PO TABS: 75.0000 mg | ORAL_TABLET | Freq: Two times a day (BID) | ORAL | Status: DC

## 2012-08-09 NOTE — Patient Instructions (Signed)
EFFEXOR: Take one pill a day for one week then increase to one pill twice a day thereafter.  LEXAPRO: Take one pill daily for one week then stop.

## 2012-08-09 NOTE — Progress Notes (Signed)
Subjective:    Patient ID: [name], female    DOB: 08/11/1961, 51 y.o.   MRN: [id number]  HPI  This is an initial visit for [name] who presents today with increasing bilateral hip and knee pain. She reports having problems initially a few years ago but her pain has increased over the last year. She was seen by [place] [facility] who told her she needed hip replacements (right more than left), but she hasn't pursued them because of financial, family, weight, and emotional considerations. She is limited with activity, even with things such as dressing because of her pain.   She is currently using naproxen for pain control as well as hydrocodone. The naproxen may help a bit but she sees no difference with the hydrocodone which she was placed on a couple months ago.  Rest sometimes helps her pain, but often even rest won't relieve her sx.  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 She was busy with his care for a long time. Since his death, she notes ongoing issues with depression and coping with his loss. She was placed on lexapro for anxiety which has helped with those symptoms, but the depression has remained a major problem.    Pain Inventory Average Pain 10 Pain Right Now 10 My pain is constant, sharp and aching  In the last 24 hours, has pain interfered with the following? General activity 10 Relation with others 10 Enjoyment of life 10 What TIME of day is your pain at its worst? all the time Sleep (in general) Poor  Pain is worse with: walking, bending, sitting, inactivity and standing Pain improves with: rest, heat/ice, medication and injections Relief from Meds: 5  Mobility walk with assistance use a walker how many minutes can you walk? 0 ability to climb steps?  no do you drive?  yes  Function not employed: date last employed 2000 disabled: date disabled 2012 I need assistance with the following:  dressing, bathing,  toileting, meal prep, household duties and shopping  Neuro/Psych weakness trouble walking depression  Prior Studies x-rays CT/MRI  Physicians involved in your care Dr Alois Cliche   Family History  Problem Relation Age of Onset  . Dementia Mother   . Hypertension Mother   . Diabetes Mother   . Dementia Father   . Heart disease Father    History   Social History  . Marital Status: Single    Spouse Name: N/A    Number of Children: N/A  . Years of Education: N/A   Social History Main Topics  . Smoking status: Current Every Day Smoker    Types: Cigarettes  . Smokeless tobacco: Never Used  . Alcohol Use: 1.0 oz/week    2 drink(s) per week  . Drug Use: No  . Sexually Active: None   Other Topics Concern  . None   Social History Narrative  . None   Past Surgical History  Procedure Date  . Cesarean section   . Colonoscopy   . Polypectomy    Past Medical History  Diagnosis Date  . Seasonal allergies   . Anxiety   . Depression   . Hypertension    BP 122/77  Pulse 90  Resp 14  Ht 5\' 2"  (1.575 m)  Wt 252 lb (114.306 kg)  BMI 46.09 kg/m2  SpO2 98%     Review of Systems  Constitutional: Positive for diaphoresis, appetite change and unexpected weight change.  Respiratory:  Positive for cough.   Gastrointestinal: Positive for constipation.  Musculoskeletal: Positive for myalgias, back pain, arthralgias and gait problem.  Neurological: Positive for weakness.  Psychiatric/Behavioral: Positive for dysphoric mood.  All other systems reviewed and are negative.       Objective:   Physical Exam  General: Alert and oriented x 3, morbidly obese HEENT: Head is normocephalic, atraumatic, PERRLA, EOMI, sclera anicteric, oral mucosa pink and moist, dentition intact, ext ear canals clear,  Neck: Supple without JVD or lymphadenopathy Heart: Reg rate and rhythm. No murmurs rubs or gallops Chest: CTA bilaterally without wheezes, rales, or rhonchi; no  distress Abdomen: Soft, non-tender, non-distended, bowel sounds positive. Extremities: No clubbing, cyanosis, or edema. Pulses are 2+ Skin: Clean and intact without signs of breakdown Neuro: Pt is cognitively appropriate with normal insight, memory, and awareness. Cranial nerves 2-12 are intact. Sensory exam is normal. Reflexes are 2+ in all 4's. Fine motor coordination is intact. No tremors. Motor function is grossly 5/5.  Musculoskeletal: severe pain in bilateral hips with ER and IR, and even with HF agst gravity. She had extreme difficulty with ambulation and transfer as well. Both legs were ER'd. Valgus deformities are seen at each knee. Come crepitus was appreciated bilaterally at knees. Left pec major tender at shoulder, she had pain with abduction of shoulder as well as resisted shoulder adduction.   Psych: Pt is pleasant, but frequently became tearful during our exam.         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 OA of bilateral knees 3. Morbid obesity 4. Left pectoral strain 5. Depression   Plan: 1. Given her clinical presentation, I think that hip replacements will provide her the best outcome at this time. Ultimately, it is up to her as to when she is ready to proceed. I told her that there may never be a perfect time to go ahead with things. 2. In the meantime, i would be willing to help her with pain control. We can convert her to oxycodone to see if she has a better response. We can do so once her urine drug screen results are complete and appropriate. 3. I wrote her some robaxin to help with spasm in her left shoulder. I also advised ice and relative rest. The left pectoral strain is more an overuse issue than anything else, as she is dependent upon her UE's for transfers, etc.  4. Will change to her to effexor to hellp with pain control and depression/anxiety. She has been on the lexapro for some time now, and while it has helped her anxiety, depression  remains a major issue and is having a huge impact on her pain. 5. All questions were encouraged and answered. 45 minutes of face to face patient care time were spent during this visit. All questions were encouraged and answered.

## 2012-08-16 ENCOUNTER — Telehealth: Payer: Self-pay | Admitting: *Deleted

## 2012-08-16 MED ORDER — OXYCODONE-ACETAMINOPHEN 5-325 MG PO TABS: 1.0000 | ORAL_TABLET | Freq: Four times a day (QID) | ORAL | Status: DC | PRN

## 2012-08-16 NOTE — Telephone Encounter (Signed)
done

## 2012-08-16 NOTE — Telephone Encounter (Signed)
Message forwarded to Dr. Riley Kill. Previous Note says will Rx Oxycodone if UDS is appropriate.

## 2012-08-16 NOTE — Telephone Encounter (Signed)
Prescription was printed out and put on Dr. Riley Kill desk to sign

## 2012-08-16 NOTE — Telephone Encounter (Signed)
Patient is calling wanting to get her Urine Drug Screen Results. She would like to know if something is going to be called in for her pain?

## 2012-08-19 NOTE — Telephone Encounter (Signed)
Patient aware Prescription is ready for pick up.

## 2012-08-26 ENCOUNTER — Telehealth: Payer: Self-pay | Admitting: *Deleted

## 2012-08-26 NOTE — Telephone Encounter (Signed)
What is methocarbamol that was prescribed for me?  Can someone call?

## 2012-08-27 NOTE — Telephone Encounter (Signed)
Left message regarding patients question about methocarbamol.

## 2012-08-27 NOTE — Telephone Encounter (Signed)
2nd unsuccessful  attempt to reach patient. LVM

## 2012-08-28 ENCOUNTER — Telehealth: Payer: Self-pay | Admitting: Physical Medicine & Rehabilitation

## 2012-08-28 NOTE — Telephone Encounter (Signed)
Tried again to reach patient

## 2012-08-28 NOTE — Telephone Encounter (Signed)
Please call patient at [phone] about her medication issues, number given was an incorrect number.

## 2012-08-28 NOTE — Telephone Encounter (Signed)
[name] clarified any questions patient had regarding her medications.

## 2012-08-28 NOTE — Telephone Encounter (Signed)
Called multiple times to contact patient regarding methocarbamol questions.  Unable to reach her.

## 2012-09-04 ENCOUNTER — Encounter: Payer: Medicaid Other | Attending: Physical Medicine & Rehabilitation | Admitting: Physical Medicine & Rehabilitation

## 2012-09-04 ENCOUNTER — Telehealth: Payer: Self-pay | Admitting: *Deleted

## 2012-09-04 DIAGNOSIS — X58XXXA Exposure to other specified factors, initial encounter: Secondary | ICD-10-CM | POA: Insufficient documentation

## 2012-09-04 DIAGNOSIS — M169 Osteoarthritis of hip, unspecified: Secondary | ICD-10-CM | POA: Insufficient documentation

## 2012-09-04 DIAGNOSIS — F329 Major depressive disorder, single episode, unspecified: Secondary | ICD-10-CM | POA: Insufficient documentation

## 2012-09-04 DIAGNOSIS — M1610 Unilateral primary osteoarthritis, unspecified hip: Secondary | ICD-10-CM | POA: Insufficient documentation

## 2012-09-04 DIAGNOSIS — IMO0002 Reserved for concepts with insufficient information to code with codable children: Secondary | ICD-10-CM | POA: Insufficient documentation

## 2012-09-04 DIAGNOSIS — F3289 Other specified depressive episodes: Secondary | ICD-10-CM | POA: Insufficient documentation

## 2012-09-04 DIAGNOSIS — M1710 Unilateral primary osteoarthritis, unspecified knee: Secondary | ICD-10-CM | POA: Insufficient documentation

## 2012-09-04 NOTE — Telephone Encounter (Signed)
Patient called stating she is in excruciating pain. She is unable to move and can hardly get out of bed. Had an appointment today with Dr. Riley Kill at 12:20. Patient is not able to make appointment and would like to know if she can change her pain medications. Notified patient that since these are controlled medications she would need to be evaluated in the office. Can not change medications over the phone. Patient went ahead and changed her appointment until January. Has not been seen since 07/2012. Will need appointment before further refills on medication.

## 2012-10-08 ENCOUNTER — Encounter: Payer: Medicaid Other | Attending: Physical Medicine & Rehabilitation | Admitting: Physical Medicine & Rehabilitation

## 2012-10-08 DIAGNOSIS — M1610 Unilateral primary osteoarthritis, unspecified hip: Secondary | ICD-10-CM | POA: Insufficient documentation

## 2012-10-08 DIAGNOSIS — X58XXXA Exposure to other specified factors, initial encounter: Secondary | ICD-10-CM | POA: Insufficient documentation

## 2012-10-08 DIAGNOSIS — IMO0002 Reserved for concepts with insufficient information to code with codable children: Secondary | ICD-10-CM | POA: Insufficient documentation

## 2012-10-08 DIAGNOSIS — F3289 Other specified depressive episodes: Secondary | ICD-10-CM | POA: Insufficient documentation

## 2012-10-08 DIAGNOSIS — F329 Major depressive disorder, single episode, unspecified: Secondary | ICD-10-CM | POA: Insufficient documentation

## 2012-10-08 DIAGNOSIS — M1710 Unilateral primary osteoarthritis, unspecified knee: Secondary | ICD-10-CM | POA: Insufficient documentation

## 2012-10-08 DIAGNOSIS — M169 Osteoarthritis of hip, unspecified: Secondary | ICD-10-CM | POA: Insufficient documentation

## 2012-10-14 ENCOUNTER — Telehealth: Payer: Self-pay | Admitting: *Deleted

## 2012-10-14 NOTE — Telephone Encounter (Signed)
Yes patient needs letter stating she is discharging herself so Dr. French Ana can treat her.

## 2012-10-14 NOTE — Telephone Encounter (Signed)
Patient needs a letter stating she wants to be released. Patient states the letter needs to states she is releasing herself from this practice and no longer needing our services. Please fax to attention Dr. French Ana F# [phone] at [facility].

## 2012-10-14 NOTE — Telephone Encounter (Signed)
She needs a letter saying she's releasing herself? I'm confused. We can send a letter stating she's been discharged by her own choice. i'm not sure what purpose this serves.

## 2012-10-16 NOTE — Telephone Encounter (Signed)
Why am i having to generate a letter for this. If she's discharging from the practice a formal discharge letter needs to be issued like we do for everyone else

## 2012-10-16 NOTE — Telephone Encounter (Signed)
Letter faxed to Dr French Ana releasing patient from practice.

## 2012-10-16 NOTE — Telephone Encounter (Signed)
Patient calling to check on status of letter.

## 2013-02-19 ENCOUNTER — Other Ambulatory Visit: Payer: Self-pay | Admitting: Orthopedic Surgery

## 2013-03-03 ENCOUNTER — Other Ambulatory Visit (HOSPITAL_COMMUNITY): Payer: Medicaid Other

## 2013-03-21 ENCOUNTER — Encounter (HOSPITAL_COMMUNITY): Payer: Self-pay | Admitting: Pharmacy Technician

## 2013-03-24 ENCOUNTER — Encounter (HOSPITAL_COMMUNITY)
Admission: RE | Admit: 2013-03-24 | Discharge: 2013-03-24 | Disposition: A | Discharge status: Home / Self Care | Payer: Medicaid Other | Source: Ambulatory Visit | Attending: Orthopedic Surgery | Admitting: Orthopedic Surgery

## 2013-03-24 ENCOUNTER — Encounter (HOSPITAL_COMMUNITY): Payer: Self-pay

## 2013-03-24 HISTORY — DX: Unspecified osteoarthritis, unspecified site: M19.90

## 2013-03-24 HISTORY — DX: Adverse effect of unspecified anesthetic, initial encounter: T41.45XA

## 2013-03-24 HISTORY — DX: Other complications of anesthesia, initial encounter: T88.59XA

## 2013-03-24 LAB — BASIC METABOLIC PANEL
BUN: 18 mg/dL (ref 6–23)
CO2: 27 mEq/L (ref 19–32)
Calcium: 8.7 mg/dL (ref 8.4–10.5)
Creatinine, Ser: 0.73 mg/dL (ref 0.50–1.10)
Glucose, Bld: 113 mg/dL — ABNORMAL HIGH (ref 70–99)

## 2013-03-24 LAB — URINALYSIS, ROUTINE W REFLEX MICROSCOPIC
Hgb urine dipstick: NEGATIVE
Nitrite: NEGATIVE
Specific Gravity, Urine: 1.035 — ABNORMAL HIGH (ref 1.005–1.030)
pH: 5.5 (ref 5.0–8.0)

## 2013-03-24 LAB — CBC WITH DIFFERENTIAL/PLATELET
Basophils Absolute: 0 10*3/uL (ref 0.0–0.1)
Eosinophils Absolute: 0.1 10*3/uL (ref 0.0–0.7)
Eosinophils Relative: 1 % (ref 0–5)
MCH: 28.9 pg (ref 26.0–34.0)
MCHC: 32.8 g/dL (ref 30.0–36.0)
MCV: 88.1 fL (ref 78.0–100.0)
Platelets: 238 10*3/uL (ref 150–400)
RDW: 14.6 % (ref 11.5–15.5)

## 2013-03-24 LAB — SURGICAL PCR SCREEN
MRSA, PCR: NEGATIVE
Staphylococcus aureus: NEGATIVE

## 2013-03-24 LAB — APTT: aPTT: 23 seconds — ABNORMAL LOW (ref 24–37)

## 2013-03-24 LAB — PROTIME-INR: Prothrombin Time: 12.8 seconds (ref 11.6–15.2)

## 2013-03-24 LAB — TYPE AND SCREEN

## 2013-03-24 LAB — ABO/RH: ABO/RH(D): O POS

## 2013-03-24 NOTE — Progress Notes (Signed)
03/24/13 1129  OBSTRUCTIVE SLEEP APNEA  Have you ever been diagnosed with sleep apnea through a sleep study? No  Do you snore loudly (loud enough to be heard through closed doors)?  1  Do you often feel tired, fatigued, or sleepy during the daytime? 0  Has anyone observed you stop breathing during your sleep? 0  Do you have, or are you being treated for high blood pressure? 1  BMI more than 35 kg/m2? 1  Age over 52 years old? 1  Neck circumference greater than 40 cm/18 inches? 0  Gender: 0  Obstructive Sleep Apnea Score 4  Score 4 or greater  Results sent to PCP

## 2013-03-24 NOTE — Pre-Procedure Instructions (Addendum)
[name]  03/24/2013   Your procedure is scheduled on:  03/31/13  Report to [facility] at 530 AM.  Call this number if you have problems the morning of surgery: [phone]   Remember:   Do not eat food or drink liquids after midnight.   Take these medicines the morning of surgery with A SIP OF WATER: lexapro, hydrocodone                 STOP naproxyn(aleve) all nsaids , aspirin   Do not wear jewelry, make-up or nail polish.  Do not wear lotions, powders, or perfumes. You may wear deodorant.  Do not shave 48 hours prior to surgery. Men may shave face and neck.  Do not bring valuables to the hospital.  [facility] is not responsible                   for any belongings or valuables.  Contacts, dentures or bridgework may not be worn into surgery.  Leave suitcase in the car. After surgery it may be brought to your room.  For patients admitted to the hospital, checkout time is 11:00 AM the day of  discharge.   Patients discharged the day of surgery will not be allowed to drive  home.  Name and phone number of your driver:   Special Instructions: Incentive Spirometry - Practice and bring it with you on the day of surgery. Shower using CHG 2 nights before surgery and the night before surgery.  If you shower the day of surgery use CHG.  Use special wash - you have one bottle of CHG for all showers.  You should use approximately 1/3 of the bottle for each shower.   Please read over the following fact sheets that you were given: Pain Booklet, Coughing and Deep Breathing, Blood Transfusion Information, Total Joint Packet, MRSA Information and Surgical Site Infection Prevention

## 2013-03-25 NOTE — Progress Notes (Signed)
Anesthesia Chart Review:  Patient is a 52 year old female scheduled for right THA on 03/31/13 by Dr. Turner Daniels.  History includes morbid obesity, smoking, HTN, anxiety, depression, arthritis, hemorrhage following c-section.  OSA screening score was 4.  PCP is listed as Alois Cliche, PA-C.  Preoperative labs and CXR noted.  EKG on 03/24/13 showed NSR, possible LAE, cannot rule out anterior infarct (age undetermined).  Her QRS complex is smaller in V3 and she has an inverted P wave in V1, but otherwise EKG appears stable when compared to prior EKGs from 2011 and 2010.  No CV symptoms were documented from her PAT visit.  She does have a history of smoking, HTN, and obesity, but no known history of MI/CHF or DM.  Clinical correlation on the day of surgery, but if she remains asymptomatic from a CV standpoint then I would anticipate that she could proceed as planned.    Velna Ochs [facility]/Anesthesiology Phone [phone] 03/25/2013 10:01 AM

## 2013-03-26 NOTE — H&P (Signed)
[name] is an 52 y.o. female.   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  She also has significant knee pain, but her knee  X-rays did not show any significant arthritic changes.  The pain wakes the patient at night, affects her ability to do chores, she has to have help around the house.  She has trouble getting her shoe wear on and off, anti-inflammatory medicines have not helped, and in the past, narcotics have not helped either.  She can barely ambulate using a cane and is pretty much at her wits end.  The patient is 5 feet 3 inches tall, weighs 250 pounds, her BMI is 43.  She is seen for consideration of possible hip replacement, from Dr. Sherene Sires office.  The patient smokes one half of pack of cigarettes a week, and is not a diabetic.  Past Medical History  Diagnosis Date  . Seasonal allergies   . Anxiety   . Depression   . Hypertension   . Complication of anesthesia     ? anesth. hemmorhaged after c section  . Arthritis     Past Surgical History  Procedure Laterality Date  . Cesarean section    . Colonoscopy    . Polypectomy      Family History  Problem Relation Age of Onset  . Dementia Mother   . Hypertension Mother   . Diabetes Mother   . Dementia Father   . Heart disease Father    Social History:  reports that she has been smoking Cigarettes.  She has a 6.25 pack-year smoking history. She has never used smokeless tobacco. She reports that she drinks about 1.0 ounces of alcohol per week. She reports that she does not use illicit drugs.  Allergies:  Allergies  Allergen Reactions  . Other     Patient does not do well with anesthesia.    No prescriptions prior to admission    Results for orders placed during the hospital encounter of 03/24/13 (from the past 48 hour(s))  SURGICAL  PCR SCREEN     Status: None   Collection Time    03/24/13 11:45 AM      Result Value Range   MRSA, PCR NEGATIVE  NEGATIVE   Staphylococcus aureus NEGATIVE  NEGATIVE   Comment:            The Xpert SA Assay (FDA     approved for NASAL specimens     in patients over 75 years of age),     is one component of     a comprehensive surveillance     program.  Test performance has     been validated by [employer] for patients greater     than or equal to 53 year old.     It is not intended     to diagnose infection nor to     guide or monitor treatment.  URINALYSIS, ROUTINE W REFLEX MICROSCOPIC     Status: Abnormal   Collection Time    03/24/13 11:46 AM      Result Value Range   Color, Urine AMBER (*) YELLOW   Comment: BIOCHEMICALS MAY BE AFFECTED BY COLOR   APPearance CLEAR  CLEAR   Specific Gravity, Urine 1.035 (*) 1.005 - 1.030   pH 5.5  5.0 - 8.0  Glucose, UA NEGATIVE  NEGATIVE mg/dL   Hgb urine dipstick NEGATIVE  NEGATIVE   Bilirubin Urine SMALL (*) NEGATIVE   Ketones, ur NEGATIVE  NEGATIVE mg/dL   Protein, ur NEGATIVE  NEGATIVE mg/dL   Urobilinogen, UA 0.2  0.0 - 1.0 mg/dL   Nitrite NEGATIVE  NEGATIVE   Leukocytes, UA NEGATIVE  NEGATIVE   Comment: MICROSCOPIC NOT DONE ON URINES WITH NEGATIVE PROTEIN, BLOOD, LEUKOCYTES, NITRITE, OR GLUCOSE <1000 mg/dL.  ABO/RH     Status: None   Collection Time    03/24/13 12:02 PM      Result Value Range   ABO/RH(D) O POS    APTT     Status: Abnormal   Collection Time    03/24/13 12:06 PM      Result Value Range   aPTT 23 (*) 24 - 37 seconds  BASIC METABOLIC PANEL     Status: Abnormal   Collection Time    03/24/13 12:06 PM      Result Value Range   Sodium 144  135 - 145 mEq/L   Potassium 4.1  3.5 - 5.1 mEq/L   Chloride 108  96 - 112 mEq/L   CO2 27  19 - 32 mEq/L   Glucose, Bld 113 (*) 70 - 99 mg/dL   BUN 18  6 - 23 mg/dL   Creatinine, Ser 1.61  0.50 - 1.10 mg/dL   Calcium 8.7  8.4 - 09.6 mg/dL   GFR calc non Af Amer >90   >90 mL/min   GFR calc Af Amer >90  >90 mL/min   Comment:            The eGFR has been calculated     using the CKD EPI equation.     This calculation has not been     validated in all clinical     situations.     eGFR's persistently     <90 mL/min signify     possible Chronic Kidney Disease.  CBC WITH DIFFERENTIAL     Status: None   Collection Time    03/24/13 12:06 PM      Result Value Range   WBC 8.0  4.0 - 10.5 K/uL   RBC 4.54  3.87 - 5.11 MIL/uL   Hemoglobin 13.1  12.0 - 15.0 g/dL   HCT 04.5  40.9 - 81.1 %   MCV 88.1  78.0 - 100.0 fL   MCH 28.9  26.0 - 34.0 pg   MCHC 32.8  30.0 - 36.0 g/dL   RDW 91.4  78.2 - 95.6 %   Platelets 238  150 - 400 K/uL   Neutrophils Relative % 73  43 - 77 %   Neutro Abs 5.9  1.7 - 7.7 K/uL   Lymphocytes Relative 21  12 - 46 %   Lymphs Abs 1.7  0.7 - 4.0 K/uL   Monocytes Relative 4  3 - 12 %   Monocytes Absolute 0.3  0.1 - 1.0 K/uL   Eosinophils Relative 1  0 - 5 %   Eosinophils Absolute 0.1  0.0 - 0.7 K/uL   Basophils Relative 0  0 - 1 %   Basophils Absolute 0.0  0.0 - 0.1 K/uL  PROTIME-INR     Status: None   Collection Time    03/24/13 12:06 PM      Result Value Range   Prothrombin Time 12.8  11.6 - 15.2 seconds   INR 0.98  0.00 - 1.49  TYPE AND SCREEN  Status: None   Collection Time    03/24/13 12:06 PM      Result Value Range   ABO/RH(D) O POS     Antibody Screen NEG     Sample Expiration 04/07/2013     Dg Chest 2 View  03/24/2013   *RADIOLOGY REPORT*  Clinical Data: Preoperative respiratory examination.  Planned total hip arthroplasty.  CHEST - 2 VIEW  Comparison: None.  Findings: The heart size and mediastinal contours are normal. The lungs are clear. There is no pleural effusion or pneumothorax. No acute osseous findings are identified.  There is symmetric prominence of the first costochondral margins.  IMPRESSION: No active cardiopulmonary process.   Original Report Authenticated By: Carey Bullocks, M.D.    Review of  Systems  Constitutional: Negative.   HENT: Negative.   Eyes: Negative.   Respiratory: Negative.   Cardiovascular: Negative.   Gastrointestinal: Negative.   Genitourinary: Negative.   Musculoskeletal: Positive for joint pain.  Skin: Negative.   Neurological: Negative.   Endo/Heme/Allergies: Negative.   Psychiatric/Behavioral: Positive for depression.    There were no vitals taken for this visit. Physical Exam  Constitutional: She is oriented to person, place, and time. She appears well-developed and well-nourished.  HENT:  Head: Normocephalic.  Eyes: Pupils are equal, round, and reactive to light.  Cardiovascular: Intact distal pulses.   Respiratory: Effort normal.  Musculoskeletal:       Right hip: She exhibits decreased range of motion and crepitus.  Neurological: She is alert and oriented to person, place, and time.     Assessment/Plan Assess: 52 year old woman, BMI is 43, end-stage arthritis of both hips, right worse than left.  Plan: Risks and benefits of hip replacement were discussed at length with the patient.  Models were brought into the room and the surgical procedure, hospital stay in rehabilitation after hospitalization were also discussed.  Her BMI does increase her risk of complications, and this was discussed at length with her.  She is out on disability, she cannot work at this time.   Wirt Hemmerich M. 03/26/2013, 9:58 AM

## 2013-03-27 ENCOUNTER — Other Ambulatory Visit: Payer: Self-pay | Admitting: Orthopedic Surgery

## 2013-03-30 MED ORDER — DEXTROSE 5 % IV SOLN
3.0000 g | INTRAVENOUS | Status: AC
  Administered 2013-03-31: 3 g via INTRAVENOUS
  Filled 2013-03-30: qty 3000

## 2013-03-31 ENCOUNTER — Ambulatory Visit (HOSPITAL_COMMUNITY): Payer: Medicaid Other | Admitting: Anesthesiology

## 2013-03-31 ENCOUNTER — Encounter (HOSPITAL_COMMUNITY)
Admission: RE | Disposition: A | Discharge status: Home Health | Payer: Self-pay | Source: Ambulatory Visit | Attending: Orthopedic Surgery

## 2013-03-31 ENCOUNTER — Inpatient Hospital Stay (HOSPITAL_COMMUNITY): Payer: Medicaid Other

## 2013-03-31 ENCOUNTER — Encounter (HOSPITAL_COMMUNITY): Payer: Self-pay | Admitting: *Deleted

## 2013-03-31 ENCOUNTER — Inpatient Hospital Stay (HOSPITAL_COMMUNITY)
Admission: RE | Admit: 2013-03-31 | Discharge: 2013-04-03 | DRG: 470 | Disposition: A | Discharge status: Home Health | Payer: Medicaid Other | Source: Ambulatory Visit | Attending: Orthopedic Surgery | Admitting: Orthopedic Surgery

## 2013-03-31 ENCOUNTER — Encounter (HOSPITAL_COMMUNITY): Payer: Self-pay | Admitting: Vascular Surgery

## 2013-03-31 DIAGNOSIS — M1611 Unilateral primary osteoarthritis, right hip: Secondary | ICD-10-CM

## 2013-03-31 DIAGNOSIS — Z01812 Encounter for preprocedural laboratory examination: Secondary | ICD-10-CM

## 2013-03-31 DIAGNOSIS — F17200 Nicotine dependence, unspecified, uncomplicated: Secondary | ICD-10-CM | POA: Diagnosis present

## 2013-03-31 DIAGNOSIS — M1610 Unilateral primary osteoarthritis, unspecified hip: Principal | ICD-10-CM | POA: Diagnosis present

## 2013-03-31 DIAGNOSIS — Z23 Encounter for immunization: Secondary | ICD-10-CM

## 2013-03-31 DIAGNOSIS — I10 Essential (primary) hypertension: Secondary | ICD-10-CM | POA: Diagnosis present

## 2013-03-31 DIAGNOSIS — F329 Major depressive disorder, single episode, unspecified: Secondary | ICD-10-CM | POA: Diagnosis present

## 2013-03-31 DIAGNOSIS — Z833 Family history of diabetes mellitus: Secondary | ICD-10-CM

## 2013-03-31 DIAGNOSIS — M169 Osteoarthritis of hip, unspecified: Principal | ICD-10-CM | POA: Diagnosis present

## 2013-03-31 DIAGNOSIS — R110 Nausea: Secondary | ICD-10-CM | POA: Diagnosis not present

## 2013-03-31 DIAGNOSIS — F411 Generalized anxiety disorder: Secondary | ICD-10-CM | POA: Diagnosis present

## 2013-03-31 DIAGNOSIS — Z79899 Other long term (current) drug therapy: Secondary | ICD-10-CM

## 2013-03-31 DIAGNOSIS — Z6841 Body Mass Index (BMI) 40.0 and over, adult: Secondary | ICD-10-CM

## 2013-03-31 DIAGNOSIS — F3289 Other specified depressive episodes: Secondary | ICD-10-CM | POA: Diagnosis present

## 2013-03-31 DIAGNOSIS — Z8249 Family history of ischemic heart disease and other diseases of the circulatory system: Secondary | ICD-10-CM

## 2013-03-31 DIAGNOSIS — Z7982 Long term (current) use of aspirin: Secondary | ICD-10-CM

## 2013-03-31 DIAGNOSIS — D62 Acute posthemorrhagic anemia: Secondary | ICD-10-CM | POA: Diagnosis not present

## 2013-03-31 HISTORY — PX: TOTAL HIP ARTHROPLASTY: SHX124

## 2013-03-31 SURGERY — ARTHROPLASTY, HIP, TOTAL,POSTERIOR APPROACH
Anesthesia: General | Site: Hip | Laterality: Right | Wound class: Clean

## 2013-03-31 MED ORDER — PROPOFOL 10 MG/ML IV BOLUS
INTRAVENOUS | Status: DC | PRN
  Administered 2013-03-31: 30 mg via INTRAVENOUS
  Administered 2013-03-31: 200 mg via INTRAVENOUS
  Administered 2013-03-31: 30 mg via INTRAVENOUS
  Administered 2013-03-31: 50 mg via INTRAVENOUS

## 2013-03-31 MED ORDER — PHENYLEPHRINE HCL 10 MG/ML IJ SOLN
INTRAMUSCULAR | Status: DC | PRN
  Administered 2013-03-31: 40 ug via INTRAVENOUS
  Administered 2013-03-31: 80 ug via INTRAVENOUS
  Administered 2013-03-31: 40 ug via INTRAVENOUS

## 2013-03-31 MED ORDER — CHLORHEXIDINE GLUCONATE 4 % EX LIQD: 60.0000 mL | Freq: Once | CUTANEOUS | Status: DC

## 2013-03-31 MED ORDER — KCL IN DEXTROSE-NACL 20-5-0.45 MEQ/L-%-% IV SOLN
INTRAVENOUS | Status: AC
  Filled 2013-03-31: qty 1000

## 2013-03-31 MED ORDER — CELECOXIB 200 MG PO CAPS
200.0000 mg | ORAL_CAPSULE | Freq: Two times a day (BID) | ORAL | Status: DC
  Administered 2013-03-31 – 2013-04-03 (×7): 200 mg via ORAL
  Filled 2013-03-31 (×8): qty 1

## 2013-03-31 MED ORDER — HYDROMORPHONE HCL PF 1 MG/ML IJ SOLN
1.0000 mg | INTRAMUSCULAR | Status: DC | PRN
  Administered 2013-03-31 – 2013-04-01 (×7): 1 mg via INTRAVENOUS
  Filled 2013-03-31 (×7): qty 1

## 2013-03-31 MED ORDER — DEXAMETHASONE SODIUM PHOSPHATE 10 MG/ML IJ SOLN
INTRAMUSCULAR | Status: DC | PRN
  Administered 2013-03-31: 8 mg via INTRAVENOUS

## 2013-03-31 MED ORDER — ACETAMINOPHEN 10 MG/ML IV SOLN: 1000.0000 mg | Freq: Once | INTRAVENOUS | Status: DC | PRN

## 2013-03-31 MED ORDER — PHENOL 1.4 % MT LIQD: 1.0000 | OROMUCOSAL | Status: DC | PRN

## 2013-03-31 MED ORDER — MAGNESIUM HYDROXIDE 400 MG/5ML PO SUSP: 30.0000 mL | Freq: Every day | ORAL | Status: DC | PRN

## 2013-03-31 MED ORDER — METOCLOPRAMIDE HCL 5 MG PO TABS
5.0000 mg | ORAL_TABLET | Freq: Three times a day (TID) | ORAL | Status: DC | PRN
  Filled 2013-03-31: qty 2

## 2013-03-31 MED ORDER — FLEET ENEMA 7-19 GM/118ML RE ENEM: 1.0000 | ENEMA | Freq: Once | RECTAL | Status: AC | PRN

## 2013-03-31 MED ORDER — NEOSTIGMINE METHYLSULFATE 1 MG/ML IJ SOLN
INTRAMUSCULAR | Status: DC | PRN
  Administered 2013-03-31: 5 mg via INTRAVENOUS

## 2013-03-31 MED ORDER — HYDROMORPHONE HCL PF 1 MG/ML IJ SOLN
INTRAMUSCULAR | Status: AC
  Filled 2013-03-31: qty 1

## 2013-03-31 MED ORDER — BUPIVACAINE-EPINEPHRINE 0.5% -1:200000 IJ SOLN
INTRAMUSCULAR | Status: DC | PRN
  Administered 2013-03-31: 20 mL

## 2013-03-31 MED ORDER — ASPIRIN EC 325 MG PO TBEC
325.0000 mg | DELAYED_RELEASE_TABLET | Freq: Two times a day (BID) | ORAL | Status: DC
  Administered 2013-03-31 – 2013-04-03 (×7): 325 mg via ORAL
  Filled 2013-03-31 (×8): qty 1

## 2013-03-31 MED ORDER — ALUM & MAG HYDROXIDE-SIMETH 200-200-20 MG/5ML PO SUSP: 30.0000 mL | ORAL | Status: DC | PRN

## 2013-03-31 MED ORDER — METOCLOPRAMIDE HCL 5 MG/ML IJ SOLN: 5.0000 mg | Freq: Three times a day (TID) | INTRAMUSCULAR | Status: DC | PRN

## 2013-03-31 MED ORDER — MIDAZOLAM HCL 5 MG/5ML IJ SOLN
INTRAMUSCULAR | Status: DC | PRN
  Administered 2013-03-31: 2 mg via INTRAVENOUS

## 2013-03-31 MED ORDER — DEXTROSE-NACL 5-0.45 % IV SOLN: INTRAVENOUS | Status: DC

## 2013-03-31 MED ORDER — ONDANSETRON HCL 4 MG/2ML IJ SOLN: 4.0000 mg | Freq: Once | INTRAMUSCULAR | Status: DC | PRN

## 2013-03-31 MED ORDER — LACTATED RINGERS IV SOLN
INTRAVENOUS | Status: DC | PRN
  Administered 2013-03-31 (×3): via INTRAVENOUS

## 2013-03-31 MED ORDER — OXYCODONE HCL 5 MG PO TABS
5.0000 mg | ORAL_TABLET | ORAL | Status: DC | PRN
  Administered 2013-04-01 – 2013-04-03 (×11): 10 mg via ORAL
  Filled 2013-03-31 (×12): qty 2

## 2013-03-31 MED ORDER — SODIUM CHLORIDE 0.9 % IV SOLN
1000.0000 mg | INTRAVENOUS | Status: AC
  Administered 2013-03-31: 1000 mg via INTRAVENOUS
  Filled 2013-03-31: qty 10

## 2013-03-31 MED ORDER — DIPHENHYDRAMINE HCL 12.5 MG/5ML PO ELIX
12.5000 mg | ORAL_SOLUTION | ORAL | Status: DC | PRN
  Administered 2013-04-03: 25 mg via ORAL
  Filled 2013-03-31: qty 10

## 2013-03-31 MED ORDER — KCL IN DEXTROSE-NACL 20-5-0.45 MEQ/L-%-% IV SOLN
INTRAVENOUS | Status: DC
  Administered 2013-03-31: 14:00:00 via INTRAVENOUS
  Administered 2013-03-31: 125 mL via INTRAVENOUS
  Administered 2013-03-31: 21:00:00 via INTRAVENOUS
  Filled 2013-03-31 (×12): qty 1000

## 2013-03-31 MED ORDER — 0.9 % SODIUM CHLORIDE (POUR BTL) OPTIME
TOPICAL | Status: DC | PRN
  Administered 2013-03-31: 1000 mL

## 2013-03-31 MED ORDER — LISINOPRIL 10 MG PO TABS
10.0000 mg | ORAL_TABLET | Freq: Every day | ORAL | Status: DC
  Administered 2013-03-31: 10 mg via ORAL
  Filled 2013-03-31 (×4): qty 1

## 2013-03-31 MED ORDER — ACETAMINOPHEN 650 MG RE SUPP: 650.0000 mg | Freq: Four times a day (QID) | RECTAL | Status: DC | PRN

## 2013-03-31 MED ORDER — ESCITALOPRAM OXALATE 10 MG PO TABS
10.0000 mg | ORAL_TABLET | Freq: Every day | ORAL | Status: DC
  Administered 2013-04-01 – 2013-04-03 (×3): 10 mg via ORAL
  Filled 2013-03-31 (×3): qty 1

## 2013-03-31 MED ORDER — METHOCARBAMOL 500 MG PO TABS
500.0000 mg | ORAL_TABLET | Freq: Four times a day (QID) | ORAL | Status: DC | PRN
  Administered 2013-04-01 – 2013-04-03 (×3): 500 mg via ORAL
  Filled 2013-03-31 (×3): qty 1

## 2013-03-31 MED ORDER — FENTANYL CITRATE 0.05 MG/ML IJ SOLN
INTRAMUSCULAR | Status: DC | PRN
  Administered 2013-03-31 (×2): 50 ug via INTRAVENOUS
  Administered 2013-03-31 (×2): 100 ug via INTRAVENOUS
  Administered 2013-03-31 (×3): 50 ug via INTRAVENOUS

## 2013-03-31 MED ORDER — ONDANSETRON HCL 4 MG/2ML IJ SOLN: 4.0000 mg | Freq: Four times a day (QID) | INTRAMUSCULAR | Status: DC | PRN

## 2013-03-31 MED ORDER — LIDOCAINE HCL (CARDIAC) 20 MG/ML IV SOLN
INTRAVENOUS | Status: DC | PRN
  Administered 2013-03-31: 80 mg via INTRAVENOUS
  Administered 2013-03-31: 20 mg via INTRAVENOUS

## 2013-03-31 MED ORDER — BUPIVACAINE-EPINEPHRINE (PF) 0.5% -1:200000 IJ SOLN
INTRAMUSCULAR | Status: AC
  Filled 2013-03-31: qty 10

## 2013-03-31 MED ORDER — HYDROMORPHONE HCL PF 1 MG/ML IJ SOLN
0.2500 mg | INTRAMUSCULAR | Status: DC | PRN
  Administered 2013-03-31 (×3): 0.5 mg via INTRAVENOUS

## 2013-03-31 MED ORDER — GLYCOPYRROLATE 0.2 MG/ML IJ SOLN
INTRAMUSCULAR | Status: DC | PRN
  Administered 2013-03-31: .8 mg via INTRAVENOUS

## 2013-03-31 MED ORDER — ONDANSETRON HCL 4 MG PO TABS: 4.0000 mg | ORAL_TABLET | Freq: Four times a day (QID) | ORAL | Status: DC | PRN

## 2013-03-31 MED ORDER — ZOLPIDEM TARTRATE 5 MG PO TABS: 5.0000 mg | ORAL_TABLET | Freq: Every evening | ORAL | Status: DC | PRN

## 2013-03-31 MED ORDER — ONDANSETRON HCL 4 MG/2ML IJ SOLN
INTRAMUSCULAR | Status: DC | PRN
  Administered 2013-03-31 (×2): 4 mg via INTRAVENOUS

## 2013-03-31 MED ORDER — ROCURONIUM BROMIDE 100 MG/10ML IV SOLN
INTRAVENOUS | Status: DC | PRN
  Administered 2013-03-31: 50 mg via INTRAVENOUS

## 2013-03-31 MED ORDER — ASPIRIN 325 MG PO TABS: 325.0000 mg | ORAL_TABLET | Freq: Two times a day (BID) | ORAL | Status: DC

## 2013-03-31 MED ORDER — ACETAMINOPHEN 325 MG PO TABS
650.0000 mg | ORAL_TABLET | Freq: Four times a day (QID) | ORAL | Status: DC | PRN
  Administered 2013-04-01 – 2013-04-02 (×4): 650 mg via ORAL
  Filled 2013-03-31 (×4): qty 2

## 2013-03-31 MED ORDER — ARTIFICIAL TEARS OP OINT
TOPICAL_OINTMENT | OPHTHALMIC | Status: DC | PRN
  Administered 2013-03-31: 1 via OPHTHALMIC

## 2013-03-31 MED ORDER — OXYCODONE-ACETAMINOPHEN 5-325 MG PO TABS: 1.0000 | ORAL_TABLET | ORAL | Status: DC | PRN

## 2013-03-31 MED ORDER — MENTHOL 3 MG MT LOZG
1.0000 | LOZENGE | OROMUCOSAL | Status: DC | PRN
  Filled 2013-03-31 (×2): qty 9

## 2013-03-31 MED ORDER — BISACODYL 5 MG PO TBEC: 5.0000 mg | DELAYED_RELEASE_TABLET | Freq: Every day | ORAL | Status: DC | PRN

## 2013-03-31 SURGICAL SUPPLY — 56 items
BLADE SAW SAG 73X25 THK (BLADE) ×1
BLADE SAW SGTL 18X1.27X75 (BLADE) IMPLANT
BLADE SAW SGTL 73X25 THK (BLADE) ×1 IMPLANT
BLADE SAW SGTL MED 73X18.5 STR (BLADE) IMPLANT
BRUSH FEMORAL CANAL (MISCELLANEOUS) IMPLANT
CANISTER SUCTION 2500CC (MISCELLANEOUS) ×1 IMPLANT
CAPT HIP PF COP ×1 IMPLANT
CLOTH BEACON ORANGE TIMEOUT ST (SAFETY) ×2 IMPLANT
COVER BACK TABLE 24X17X13 BIG (DRAPES) IMPLANT
COVER SURGICAL LIGHT HANDLE (MISCELLANEOUS) ×4 IMPLANT
DRAPE ORTHO SPLIT 77X108 STRL (DRAPES) ×2
DRAPE PROXIMA HALF (DRAPES) ×2 IMPLANT
DRAPE SURG ORHT 6 SPLT 77X108 (DRAPES) ×1 IMPLANT
DRAPE U-SHAPE 47X51 STRL (DRAPES) ×2 IMPLANT
DRILL BIT 7/64X5 (BIT) ×2 IMPLANT
DRSG MEPILEX BORDER 4X12 (GAUZE/BANDAGES/DRESSINGS) ×2 IMPLANT
DRSG MEPILEX BORDER 4X8 (GAUZE/BANDAGES/DRESSINGS) ×2 IMPLANT
DURAPREP 26ML APPLICATOR (WOUND CARE) ×2 IMPLANT
ELECT BLADE 4.0 EZ CLEAN MEGAD (MISCELLANEOUS)
ELECT REM PT RETURN 9FT ADLT (ELECTROSURGICAL) ×2
ELECTRODE BLDE 4.0 EZ CLN MEGD (MISCELLANEOUS) IMPLANT
ELECTRODE REM PT RTRN 9FT ADLT (ELECTROSURGICAL) ×1 IMPLANT
FACESHIELD LNG OPTICON STERILE (SAFETY) ×1 IMPLANT
GAUZE XEROFORM 1X8 LF (GAUZE/BANDAGES/DRESSINGS) ×2 IMPLANT
GLOVE BIO SURGEON STRL SZ7 (GLOVE) ×2 IMPLANT
GLOVE BIO SURGEON STRL SZ7.5 (GLOVE) ×2 IMPLANT
GLOVE BIOGEL PI IND STRL 7.0 (GLOVE) ×1 IMPLANT
GLOVE BIOGEL PI IND STRL 8 (GLOVE) ×1 IMPLANT
GLOVE BIOGEL PI INDICATOR 7.0 (GLOVE) ×1
GLOVE BIOGEL PI INDICATOR 8 (GLOVE) ×1
GOWN PREVENTION PLUS XLARGE (GOWN DISPOSABLE) ×2 IMPLANT
GOWN STRL NON-REIN LRG LVL3 (GOWN DISPOSABLE) ×4 IMPLANT
HANDPIECE INTERPULSE COAX TIP (DISPOSABLE)
HOOD PEEL AWAY FACE SHEILD DIS (HOOD) ×4 IMPLANT
KIT BASIN OR (CUSTOM PROCEDURE TRAY) ×2 IMPLANT
KIT ROOM TURNOVER OR (KITS) ×2 IMPLANT
MANIFOLD NEPTUNE II (INSTRUMENTS) ×1 IMPLANT
NEEDLE 22X1 1/2 (OR ONLY) (NEEDLE) ×2 IMPLANT
NS IRRIG 1000ML POUR BTL (IV SOLUTION) ×2 IMPLANT
PACK TOTAL JOINT (CUSTOM PROCEDURE TRAY) ×2 IMPLANT
PAD ARMBOARD 7.5X6 YLW CONV (MISCELLANEOUS) ×4 IMPLANT
PASSER SUT SWANSON 36MM LOOP (INSTRUMENTS) ×2 IMPLANT
PRESSURIZER FEMORAL UNIV (MISCELLANEOUS) IMPLANT
SET HNDPC FAN SPRY TIP SCT (DISPOSABLE) IMPLANT
SUT ETHIBOND 2 V 37 (SUTURE) ×2 IMPLANT
SUT ETHILON 3 0 FSL (SUTURE) ×2 IMPLANT
SUT VIC AB 0 CTB1 27 (SUTURE) ×3 IMPLANT
SUT VIC AB 1 CTX 36 (SUTURE) ×2
SUT VIC AB 1 CTX36XBRD ANBCTR (SUTURE) ×1 IMPLANT
SUT VIC AB 2-0 CTB1 (SUTURE) ×2 IMPLANT
SYR CONTROL 10ML LL (SYRINGE) ×2 IMPLANT
TOWEL OR 17X24 6PK STRL BLUE (TOWEL DISPOSABLE) ×2 IMPLANT
TOWEL OR 17X26 10 PK STRL BLUE (TOWEL DISPOSABLE) ×2 IMPLANT
TOWER CARTRIDGE SMART MIX (DISPOSABLE) IMPLANT
TRAY FOLEY CATH 14FR (SET/KITS/TRAYS/PACK) ×1 IMPLANT
WATER STERILE IRR 1000ML POUR (IV SOLUTION) ×8 IMPLANT

## 2013-03-31 NOTE — Preoperative (Signed)
Beta Blockers   Reason not to administer Beta Blockers:Not Applicable 

## 2013-03-31 NOTE — Evaluation (Signed)
Occupational Therapy Evaluation Patient Details Name: [name] MRN: [id number] DOB: 04-14-1961 Today's Date: 03/31/2013 Time: 0865-7846 OT Time Calculation (min): 19 min  OT Assessment / Plan / Recommendation History of present illness Patient is a 52 yo female s/p Rt. THA.  Patient reports she is to have her Lt hip replaced soon as well.   Clinical Impression   Pt presents with below problem list. Will benefit from acute OT to increase independence prior to d/c home.     OT Assessment  Patient needs continued OT Services    Follow Up Recommendations  Home health OT;Supervision/Assistance - 24 hour    Barriers to Discharge      Equipment Recommendations  3 in 1 bedside comode;Other (comment) (tub equipment tbd)    Recommendations for Other Services    Frequency  Min 2X/week    Precautions / Restrictions Precautions Precautions: Posterior Hip Precaution Booklet Issued: Yes (comment)-PT gave to pt  Precaution Comments: Reviewed precautions with patient. Restrictions Weight Bearing Restrictions: Yes RLE Weight Bearing: Weight bearing as tolerated   Pertinent Vitals/Pain Pain 6/10 at end of session. Repositioned.     ADL  Eating/Feeding: Independent Where Assessed - Eating/Feeding: Chair Grooming: Set up Where Assessed - Grooming: Supported sitting Upper Body Bathing: Set up Where Assessed - Upper Body Bathing: Supported sitting Lower Body Bathing: Moderate assistance Where Assessed - Lower Body Bathing: Supported sit to stand Upper Body Dressing: Set up Where Assessed - Upper Body Dressing: Supported sitting Lower Body Dressing: Moderate assistance Where Assessed - Lower Body Dressing: Supported sit to Pharmacist, hospital: [facility] Method: Sit to Barista: Other (comment) (from recliner chair) Tub/Shower Transfer Method: Not assessed Equipment Used: Gait belt;Rolling walker Transfers/Ambulation  Related to ADLs: Min A  ADL Comments: Pt at overall Mod A level for LB ADLs. OT briefly explained that AE is available to assist with this and will practice at later session.     OT Diagnosis: Acute pain  OT Problem List: Decreased strength;Decreased range of motion;Decreased activity tolerance;Impaired balance (sitting and/or standing);Decreased knowledge of use of DME or AE;Decreased knowledge of precautions;Pain OT Treatment Interventions: Self-care/ADL training;DME and/or AE instruction;Therapeutic activities;Patient/family education;Balance training   OT Goals(Current goals can be found in the care plan section) Acute Rehab OT Goals Patient Stated Goal: To walk.  To go home. OT Goal Formulation: With patient Time For Goal Achievement: 04/07/13 Potential to Achieve Goals: Good ADL Goals Pt Will Perform Grooming: with modified independence;standing Pt Will Perform Lower Body Bathing: with supervision;with adaptive equipment;sit to/from stand Pt Will Perform Lower Body Dressing: with supervision;with adaptive equipment;sit to/from stand Pt Will Transfer to Toilet: with modified independence;ambulating;bedside commode Pt Will Perform Toileting - Clothing Manipulation and hygiene: with modified independence;sit to/from stand Pt Will Perform Tub/Shower Transfer: Tub transfer;with supervision;ambulating;tub bench;3 in 1;rolling walker Additional ADL Goal #1: Pt will be able to verbalize and demonstrate 3/3 precautions independently.  Visit Information  Last OT Received On: 03/31/13 Assistance Needed: +1 History of Present Illness: Patient is a 52 yo female s/p Rt. THA.  Patient reports she is to have her Lt hip replaced soon as well.       Prior Functioning     Home Living Family/patient expects to be discharged to:: Private residence Living Arrangements: Spouse/significant other Available Help at Discharge: Family;Available 24 hours/day (Husband, sister) Type of Home: House Home  Access: Stairs to enter [employer] of Steps: 3 Entrance Stairs-Rails: None Home Layout: Two level;Able to live  on main level with bedroom/bathroom Home Equipment: [name] - single point Prior Function Level of Independence: Independent with assistive device(s);Needs assistance Gait / Transfers Assistance Needed: Using cane due to hip pain. ADL's / Homemaking Assistance Needed: Assist needed for bathing, meal prep, and housekeeping. Communication Communication: No difficulties         Vision/Perception     Cognition  Cognition Arousal/Alertness: Awake/alert Behavior During Therapy: WFL for tasks assessed/performed Overall Cognitive Status: Within Functional Limits for tasks assessed    Extremity/Trunk Assessment Upper Extremity Assessment Upper Extremity Assessment: Overall WFL for tasks assessed     Mobility Bed Mobility Bed Mobility: Not assessed Transfers Transfers: Sit to Stand;Stand to Sit Sit to Stand: With upper extremity assist;From chair/3-in-1;4: Min assist Stand to Sit: 4: Min assist;With upper extremity assist;To chair/3-in-1 Details for Transfer Assistance: Cues to control decent to chair. Cues for hand placement and positioning of RLE.        Balance     End of Session OT - End of Session Equipment Utilized During Treatment: Rolling walker;Gait belt Activity Tolerance: Patient tolerated treatment well;Patient limited by pain Patient left: in chair;with call bell/phone within reach;with family/visitor present Nurse Communication: Other (comment) (IV disconnected)  GO     [name] OTR/L [phone] 03/31/2013, 5:16 PM

## 2013-03-31 NOTE — Transfer of Care (Signed)
Immediate Anesthesia Transfer of Care Note  Patient: [name]  Procedure(s) Performed: Procedure(s) with comments: TOTAL HIP ARTHROPLASTY (Right) - DEPUY/SROM RIGHT TOTAL HIP ARTHROPLASTY  Patient Location: PACU  Anesthesia Type:General  Level of Consciousness: awake, oriented and patient cooperative  Airway & Oxygen Therapy: Patient Spontanous Breathing and Patient connected to face mask oxygen  Post-op Assessment: Report given to PACU RN, Post -op Vital signs reviewed and stable and Patient moving all extremities X 4  Post vital signs: Reviewed and stable  Complications: No apparent anesthesia complications

## 2013-03-31 NOTE — Op Note (Signed)
OPERATIVE REPORT    DATE OF PROCEDURE:  03/31/2013       PREOPERATIVE DIAGNOSIS:  OSTEOARHTRITIS RIGHT HIP                                                           POSTOPERATIVE DIAGNOSIS:  OSTEOARHTRITIS RIGHT HIP                                                           PROCEDURE:  R total hip arthroplasty using a 52 mm DePuy Pinnacle  Cup, [employer], 10-degree polyethylene liner index superior  and posterior, a +0 36 mm ceramic head, a 16x11x150x36 SROM stem, 16Bsm Sleeve   SURGEON: Ornella Coderre J    ASSISTANT:   Mauricia Area, PA-C  (present throughout entire procedure and necessary for timely completion of the procedure)   ANESTHESIA: General BLOOD LOSS: 500 FLUID REPLACEMENT: 1800 crystalloid DRAINS: Foley Catheter URINE OUTPUT: 300cc COMPLICATIONS: none    INDICATIONS FOR PROCEDURE: A 52 y.o. year-old With  OSTEOARHTRITIS RIGHT HIP    for 3 years, x-rays show bone-on-bone arthritic changes. Despite conservative measures with observation, anti-inflammatory medicine, narcotics, use of a cane, has severe unremitting pain and can ambulate only a few blocks before resting.  Patient desires elective R total hip arthroplasty to decrease pain and increase function. The risks, benefits, and alternatives were discussed at length including but not limited to the risks of infection, bleeding, nerve injury, stiffness, blood clots, the need for revision surgery, cardiopulmonary complications, among others, and they were willing to proceed. Questions answered     PROCEDURE IN DETAIL: The patient was identified by armband,  received preoperative IV antibiotics in the holding area at [facility], taken to the operating room , appropriate anesthetic monitors  were attached and general endotracheal anesthesia induced. Foley catheter was inserted. Pt was rolled into the L lateral decubitus position and fixed there with a Stulberg Mark II pelvic clamp.  The R lower extremity  was then prepped and draped  in the usual sterile fashion from the ankle to the hemipelvis. A time-out  procedure was performed. The skin along the lateral hip and thigh  infiltrated with 10 mL of 0.5% Marcaine and epinephrine solution. We  then made a posterolateral approach to the hip. With a #10 blade, a 24 cm  incision was made through the skin and subcutaneous tissue down to the level of the  IT band. Small bleeders were identified and cauterized. The IT band was cut in  line with skin incision exposing the greater trochanter. A Cobra retractor was placed between the gluteus minimus and the superior hip joint capsule, and a spiked Cobra between the quadratus femoris and the inferior hip joint capsule. This isolated the short  external rotators and piriformis tendons. These were tagged with a #2 Ethibond  suture and cut off their insertion on the intertrochanteric crest. The posterior  capsule was then developed into an acetabular-based flap from Posterior Superior off of the acetabulum out over the femoral neck and back posterior inferior to the acetabular rim. This flap was tagged with two #2  Ethibond sutures and retracted protecting the sciatic nerve. This exposed the arthritic femoral head and osteophytes. The hip was then flexed and internally rotated, dislocating the femoral head and a standard neck cut performed 1 fingerbreadth above the lesser trochanter.  A spiked Cobra was placed in the cotyloid notch and a Hohmann retractor was then used to lever the femur anteriorly off of the anterior pelvic column. A posterior-inferior wing retractor was placed at the junction of the acetabulum and the ischium completing the acetabular exposure.We then removed the peripheral osteophytes and labrum from the acetabulum. We then reamed the acetabulum up to 51 mm with basket reamers obtaining good coverage in all quadrants. We then irrigated with normal  saline solution and hammered into place a 52 mm  pinnacle cup in 45  degrees of abduction and about 20 degrees of anteversion. More  peripheral osteophytes removed and a trial 10-degree liner placed with the  index superior-posterior. The hip was then flexed and internally rotated exposing the  proximal femur, which was entered with the initiating reamer followed by  the axial reamers up to a 11.5 mm full depth and 12mm partial depth. We then conically reamed to 16B to the correct depth for a 42 base neck. The calcar was milled to 16Bsm. A trial cone and stem was inserted in the 25 degrees anteversion, with a +0 36mm trial head. Trial reduction was then performed and excellent stability was noted with at 90 of flexion with 75 of internal rotation and then full extension with maximal external rotation. The hip could not be dislocated in full extension. The knee could easily flex  to about 130 degrees. We also stretched the abductors at this point,  because of the preexisting adductor contractures. All trial components  were then removed. The acetabulum was irrigated out with normal saline  solution. A titanium [facility] was then screwed into place  followed by a 10-degree polyethylene liner index superior-posterior. On  the femoral side a 16Bsm ZTT1 sleeve was hammered into place, followed by a 16x11x36x150 SROM stem in 25 degrees of anteversion. At this point, a +0 36 mm ceramic head was  hammered on the stem. The hip was reduced. We checked our stability  one more time and found it to be excellent. The wound was once again  thoroughly irrigated out with normal saline solution pulse lavage. The  capsular flap and short external rotators were repaired back to the  intertrochanteric crest through drill holes with a #2 Ethibond suture.  The IT band was closed with running 1 Vicryl suture. The subcutaneous  tissue with 0 and 2-0 undyed Vicryl suture and the skin with running  interlocking 3-0 nylon suture. Dressing of Xeroform and Mepilex  was  then applied. The patient was then unclamped, rolled supine, awaken extubated and taken to recovery room without difficulty in stable condition.   [name] J 03/31/2013, 9:21 AM

## 2013-03-31 NOTE — Anesthesia Postprocedure Evaluation (Signed)
  Anesthesia Post-op Note  Patient: [name]  Procedure(s) Performed: Procedure(s) with comments: TOTAL HIP ARTHROPLASTY (Right) - DEPUY/SROM RIGHT TOTAL HIP ARTHROPLASTY  Patient Location: PACU  Anesthesia Type:General  Level of Consciousness: awake, alert  and oriented  Airway and Oxygen Therapy: Patient Spontanous Breathing and Patient connected to nasal cannula oxygen  Post-op Pain: mild  Post-op Assessment: Post-op Vital signs reviewed, Patient's Cardiovascular Status Stable, Respiratory Function Stable, Patent Airway and No signs of Nausea or vomiting  Post-op Vital Signs: Reviewed and stable  Complications: No apparent anesthesia complications

## 2013-03-31 NOTE — Interval H&P Note (Signed)
History and Physical Interval Note:  03/31/2013 7:14 AM  [name]  has presented today for surgery, with the diagnosis of OSTEOARHTRITIS RIGHT HIPTOTA  The various methods of treatment have been discussed with the patient and family. After consideration of risks, benefits and other options for treatment, the patient has consented to  Procedure(s) with comments: TOTAL HIP ARTHROPLASTY (Right) - DEPUY/SROM/PINNICLE POSS. CATEGORY 3 as a surgical intervention .  The patient's history has been reviewed, patient examined, no change in status, stable for surgery.  I have reviewed the patient's chart and labs.  Questions were answered to the patient's satisfaction.     [name]

## 2013-03-31 NOTE — Anesthesia Preprocedure Evaluation (Addendum)
Anesthesia Evaluation  Patient identified by MRN, date of birth, ID band Patient awake    Reviewed: Allergy & Precautions, H&P , NPO status , Patient's Chart, lab work & pertinent test results  Airway Mallampati: II TM Distance: >3 FB Neck ROM: Full    Dental  (+) Teeth Intact and Dental Advisory Given   Pulmonary Current Smoker,  breath sounds clear to auscultation        Cardiovascular hypertension, Pt. on medications Rhythm:Regular     Neuro/Psych Anxiety Depression    GI/Hepatic   Endo/Other  Morbid obesity  Renal/GU      Musculoskeletal   Abdominal   Peds  Hematology   Anesthesia Other Findings   Reproductive/Obstetrics                          Anesthesia Physical Anesthesia Plan  ASA: III  Anesthesia Plan: General   Post-op Pain Management:    Induction: Intravenous  Airway Management Planned: Oral ETT  Additional Equipment:   Intra-op Plan:   Post-operative Plan: Extubation in OR  Informed Consent: I have reviewed the patients History and Physical, chart, labs and discussed the procedure including the risks, benefits and alternatives for the proposed anesthesia with the patient or authorized representative who has indicated his/her understanding and acceptance.   Dental advisory given  Plan Discussed with: CRNA and Anesthesiologist  Anesthesia Plan Comments: (Obesity htn  DJD R. Hip   Plan GA with oral ETT  Kipp Brood, MD)        Anesthesia Quick Evaluation

## 2013-03-31 NOTE — Progress Notes (Signed)
UR COMPLETED  

## 2013-03-31 NOTE — Anesthesia Procedure Notes (Signed)
Procedure Name: Intubation Date/Time: 03/31/2013 7:44 AM Performed by: Sherie Don Pre-anesthesia Checklist: Patient identified, Emergency Drugs available, Suction available, Patient being monitored and Timeout performed Patient Re-evaluated:Patient Re-evaluated prior to inductionOxygen Delivery Method: Circle system utilized Preoxygenation: Pre-oxygenation with 100% oxygen Intubation Type: IV induction Ventilation: Two handed mask ventilation required and Oral airway inserted - appropriate to patient size Laryngoscope Size: Mac and 3 Grade View: Grade I Tube type: Oral Tube size: 7.5 mm Number of attempts: 1 Airway Equipment and Method: Stylet Placement Confirmation: ETT inserted through vocal cords under direct vision,  positive ETCO2 and breath sounds checked- equal and bilateral Tube secured with: Tape Dental Injury: Teeth and Oropharynx as per pre-operative assessment

## 2013-03-31 NOTE — Evaluation (Signed)
Physical Therapy Evaluation Patient Details Name: [name] MRN: [id number] DOB: [birth year] Today's Date: 03/31/2013 Time: 1308-6578 PT Time Calculation (min): 24 min  PT Assessment / Plan / Recommendation History of Present Illness  Patient is a 52 yo female s/p Rt. THA.  Patient reports she is to have her Lt hip replaced soon as well.  Clinical Impression  Patient presents with problems listed below.  Will benefit from acute PT to maximize independence prior to return home.    PT Assessment  Patient needs continued PT services    Follow Up Recommendations  Home health PT;Supervision/Assistance - 24 hour    Does the patient have the potential to tolerate intense rehabilitation      Barriers to Discharge        Equipment Recommendations  Rolling walker with 5" wheels;3in1 (PT)    Recommendations for Other Services     Frequency 7X/week    Precautions / Restrictions Precautions Precautions: Posterior Hip Precaution Booklet Issued: Yes (comment) Precaution Comments: Reviewed precautions with patient. Restrictions Weight Bearing Restrictions: Yes RLE Weight Bearing: Weight bearing as tolerated   Pertinent Vitals/Pain Pain 10/10 with mobility.      Mobility  Bed Mobility Bed Mobility: Supine to Sit;Sitting - Scoot to Edge of Bed Supine to Sit: 3: Mod assist;HOB flat Sitting - Scoot to Edge of Bed: 4: Min assist Details for Bed Mobility Assistance: Verbal cues for technique.  Assist to move RLE off of bed, and raise trunk to sitting.  Patient sat at EOB x 3 minutes with good balance. Transfers Transfers: Sit to Stand;Stand to [employer] Transfers Sit to Stand: 4: Min assist;With upper extremity assist;From bed Stand to Sit: 4: Min assist;With upper extremity assist;With armrests;To chair/3-in-1 Stand Pivot Transfers: 4: Min assist Details for Transfer Assistance: Verbal cues for hand placement and technique.  Assist to rise to standing and for balance.   Patient able to take several steps to pivot to chair.  Pain limiting mobility. Ambulation/Gait Ambulation/Gait Assistance: Not tested (comment)    Exercises Total Joint Exercises Ankle Circles/Pumps: AROM;Both;10 reps;Seated   PT Diagnosis: Difficulty walking;Acute pain  PT Problem List: Decreased strength;Decreased activity tolerance;Decreased balance;Decreased mobility;Decreased knowledge of use of DME;Decreased knowledge of precautions;Pain PT Treatment Interventions: DME instruction;Gait training;Stair training;Functional mobility training;Therapeutic exercise;Patient/family education     PT Goals(Current goals can be found in the care plan section) Acute Rehab PT Goals Patient Stated Goal: To walk.  To go home. PT Goal Formulation: With patient Time For Goal Achievement: 04/07/13 Potential to Achieve Goals: Good  Visit Information  Last PT Received On: 03/31/13 Assistance Needed: +1 History of Present Illness: Patient is a 52 yo female s/p Rt. THA.  Patient reports she is to have her Lt hip replaced soon as well.       Prior Functioning  Home Living Family/patient expects to be discharged to:: Private residence Living Arrangements: Spouse/significant other Available Help at Discharge: Family;Available 24 hours/day (Husband, sister) Type of Home: House Home Access: Stairs to enter [employer] of Steps: 3 Entrance Stairs-Rails: None Home Layout: Two level;Able to live on main level with bedroom/bathroom Home Equipment: Gilmer Mor - single point Prior Function Level of Independence: Independent with assistive device(s);Needs assistance Gait / Transfers Assistance Needed: Using cane due to hip pain. ADL's / Homemaking Assistance Needed: Assist needed for bathing, meal prep, and housekeeping. Communication Communication: No difficulties    Cognition  Cognition Arousal/Alertness: Awake/alert Behavior During Therapy: WFL for tasks assessed/performed Overall  Cognitive Status: Within Functional Limits  for tasks assessed    Extremity/Trunk Assessment Upper Extremity Assessment Upper Extremity Assessment: Overall WFL for tasks assessed Lower Extremity Assessment Lower Extremity Assessment: RLE deficits/detail RLE Deficits / Details: Patient able to assist with moving RLE off of bed. RLE: Unable to fully assess due to pain   Balance    End of Session PT - End of Session Equipment Utilized During Treatment: Gait belt;Oxygen Activity Tolerance: Patient limited by pain;Patient limited by fatigue Patient left: in chair;with call bell/phone within reach Nurse Communication: Mobility status;Patient requests pain meds  GP     Vena Austria 03/31/2013, 4:42 PM  [name]. [name], [facility] Acute Rehab Services Pager [phone]

## 2013-04-01 ENCOUNTER — Encounter (HOSPITAL_COMMUNITY): Payer: Self-pay | Admitting: Orthopedic Surgery

## 2013-04-01 LAB — CBC
Hemoglobin: 9.5 g/dL — ABNORMAL LOW (ref 12.0–15.0)
MCH: 28.9 pg (ref 26.0–34.0)
RBC: 3.29 MIL/uL — ABNORMAL LOW (ref 3.87–5.11)

## 2013-04-01 LAB — BASIC METABOLIC PANEL
CO2: 24 mEq/L (ref 19–32)
Calcium: 8.1 mg/dL — ABNORMAL LOW (ref 8.4–10.5)
Glucose, Bld: 123 mg/dL — ABNORMAL HIGH (ref 70–99)
Potassium: 3.6 mEq/L (ref 3.5–5.1)
Sodium: 138 mEq/L (ref 135–145)

## 2013-04-01 NOTE — Care Management Note (Signed)
    Page 1 of 2   04/01/2013     1:46:13 PM   CARE MANAGEMENT NOTE 04/01/2013  Patient:  [name],[name]   Account Number:  [id number]  Date Initiated:  03/31/2013  Documentation initiated by:  Jacquelynn Cree  Subjective/Objective Assessment:   admitted postop rt total hip     Action/Plan:   PLan return home with HHPT, OT and RN   Anticipated DC Date:  04/03/2013   Anticipated DC Plan:  HOME W HOME HEALTH SERVICES      DC Planning Services  CM consult      Choice offered to / List presented to:     DME arranged  3-N-1  WALKER - ROLLING  CANE      DME agency  [employer]     HH arranged  HH-1 RN  HH-2 PT  HH-3 OT      HH agency  [facility]   Status of service:  Completed, signed off Medicare Important Message given?   (If response is "NO", the following Medicare IM given date fields will be blank) Date Medicare IM given:   Date Additional Medicare IM given:    Discharge Disposition:  HOME W HOME HEALTH SERVICES  Per UR Regulation:    If discussed at Long Length of Stay Meetings, dates discussed:    Comments:  04/01/13 Spoke with patient, she plans to return home with HHC.Jacquelynn Cree RN, BSN, CCM  03/31/13 HHPT, OT and RN set up with [facility] by MD office. Jacquelynn Cree RN, BSN, CCM

## 2013-04-01 NOTE — Progress Notes (Signed)
Physical Therapy Treatment Patient Details Name: [name] MRN: [id number] DOB: [birth year] Today's Date: 04/01/2013 Time: 0800-0829 PT Time Calculation (min): 29 min  PT Assessment / Plan / Recommendation  PT Comments   Patient progressing well this morning after complaints of a bad night and nursing not helping her correctly. Spoke with day RN just to ensure safety with mobility. Will increase mobility and complete therex in second session today  Follow Up Recommendations  Home health PT;Supervision/Assistance - 24 hour     Does the patient have the potential to tolerate intense rehabilitation     Barriers to Discharge        Equipment Recommendations  Rolling walker with 5" wheels;3in1 (PT)    Recommendations for Other Services    Frequency 7X/week   Progress towards PT Goals Progress towards PT goals: Progressing toward goals  Plan Current plan remains appropriate    Precautions / Restrictions Precautions Precautions: Posterior Hip Precaution Booklet Issued: Yes (comment) Precaution Comments: Reviewed precautions with patient. Cues to maintain with mobility Restrictions RLE Weight Bearing: Weight bearing as tolerated   Pertinent Vitals/Pain 8/10 RN provided medication to assist with pain control    Mobility  Bed Mobility Supine to Sit: 4: Min assist Sitting - Scoot to Edge of Bed: 4: Min guard Details for Bed Mobility Assistance: Verbal cues for technique.  Assist to move RLE off of bed.  Transfers Sit to Stand: With upper extremity assist;From chair/3-in-1;4: Min assist;From bed Stand to Sit: With upper extremity assist;To chair/3-in-1;4: Min guard Details for Transfer Assistance: Cues to control decent to chair. Cues for hand placement and positioning of RLE. A to initiate stand from bed. Ambulation/Gait Ambulation/Gait Assistance: 4: Min guard Ambulation Distance (Feet): 30 Feet Assistive device: Rolling walker Ambulation/Gait Assistance Details: Cues for  gait sequence and with turning Gait Pattern: Step-to pattern    Exercises     PT Diagnosis:    PT Problem List:   PT Treatment Interventions:     PT Goals (current goals can now be found in the care plan section)    Visit Information  Last PT Received On: 04/01/13 Assistance Needed: +1 History of Present Illness: Patient is a 52 yo female s/p Rt. THA.  Patient reports she is to have her Lt hip replaced soon as well.    Subjective Data      Cognition  Cognition Arousal/Alertness: Awake/alert Behavior During Therapy: WFL for tasks assessed/performed Overall Cognitive Status: Within Functional Limits for tasks assessed    Balance  Balance Balance Assessed: Yes Static Standing Balance Static Standing - Level of Assistance: 5: Stand by assistance Static Standing - Comment/# of Minutes: x 10 mins with functional task  End of Session PT - End of Session Equipment Utilized During Treatment: Gait belt Activity Tolerance: Patient tolerated treatment well Patient left: in chair;with call bell/phone within reach Nurse Communication: Mobility status   GP     Fredrich Birks 04/01/2013, 8:39 AM  04/01/2013 Fredrich Birks PTA [phone] pager [phone] office

## 2013-04-01 NOTE — Progress Notes (Signed)
Patient ID: [name], female   DOB: Dec 05, 1960, 52 y.o.   MRN: [id number] PATIENT ID: [name]  MRN: [id number]  DOB/AGE:  03-Aug-1961 / 52 y.o.  1 Day Post-Op Procedure(s) (LRB): TOTAL HIP ARTHROPLASTY (Right)    PROGRESS NOTE Subjective: Patient is alert, oriented,mild Nausea, no Vomiting, yes passing gas, no Bowel Movement. Taking PO well. Denies SOB, Chest or Calf Pain. Using Incentive Spirometer, PAS in place. Ambulate wbat, patient walked in the room yesterday with PT no problem. Patient reports pain as mild  .    Objective: Vital signs in last 24 hours: Filed Vitals:   04/01/13 0000 04/01/13 0049 04/01/13 0400 04/01/13 0527  BP:  92/51  106/64  Pulse:  73  80  Temp:  98.2 F (36.8 C)  98.7 F (37.1 C)  TempSrc:      Resp: 18 16 16 16   SpO2:  99%  98%      Intake/Output from previous day: I/O last 3 completed shifts: In: 3735.4 [I.V.:3735.4] Out: 1800 [Urine:1350; Blood:450]   Intake/Output this shift: Total I/O In: -  Out: 1200 [Urine:1200]   LABORATORY DATA:  Recent Labs  04/01/13 0625  WBC 11.3*  HGB 9.5*  HCT 28.7*  PLT 183  NA 138  K 3.6  CL 106  CO2 24  BUN 9  CREATININE 0.57  GLUCOSE 123*  CALCIUM 8.1*    Examination: Neurologically intact ABD soft Neurovascular intact Sensation intact distally Intact pulses distally Dorsiflexion/Plantar flexion intact Incision: scant drainage No cellulitis present Compartment soft} XR AP&Lat of hip shows well placed\fixed THA  Assessment:   1 Day Post-Op Procedure(s) (LRB): TOTAL HIP ARTHROPLASTY (Right) ADDITIONAL DIAGNOSIS:  Hypertension  Plan: PT/OT WBAT, THA  posterior precautions  DVT Prophylaxis: SCDx72 hrs, ASA 325 mg BID x 2 weeks  DISCHARGE PLAN: Home in 1-2 days  DISCHARGE NEEDS: HHPT, HHRN, Walker and 3-in-1 comode seat

## 2013-04-01 NOTE — Progress Notes (Signed)
Physical Therapy Treatment Patient Details Name: [name] MRN: [id number] DOB: [birth year] Today's Date: 04/01/2013 Time: 1120-1140 PT Time Calculation (min): 20 min  PT Assessment / Plan / Recommendation  PT Comments   Patient is making good progress with PT.  From a mobility standpoint anticipate patient will be ready for DC home tomorrow after patient practices steps     Follow Up Recommendations  Home health PT;Supervision/Assistance - 24 hour     Does the patient have the potential to tolerate intense rehabilitation     Barriers to Discharge        Equipment Recommendations  Rolling walker with 5" wheels;3in1 (PT)    Recommendations for Other Services    Frequency 7X/week   Progress towards PT Goals Progress towards PT goals: Progressing toward goals  Plan Current plan remains appropriate    Precautions / Restrictions Precautions Precautions: Posterior Hip Precaution Booklet Issued: No Precaution Comments: Reviewed precautions with patient. Cues to maintain with mobility Restrictions Weight Bearing Restrictions: Yes RLE Weight Bearing: Weight bearing as tolerated   Pertinent Vitals/Pain no apparent distress     Mobility  Bed Mobility Bed Mobility: Not assessed Supine to Sit: 4: Min assist Sitting - Scoot to Edge of Bed: 4: Min guard Details for Bed Mobility Assistance: Verbal cues for technique.  Assist to move RLE off of bed.  Transfers Sit to Stand: 4: Min guard;From chair/3-in-1;With upper extremity assist Stand to Sit: 4: Min guard;With upper extremity assist;To chair/3-in-1 Details for Transfer Assistance: Cues for RLE positioning. Better stand with use of armrest Ambulation/Gait Ambulation/Gait Assistance: 4: Min guard Ambulation Distance (Feet): 90 Feet Assistive device: Rolling walker Ambulation/Gait Assistance Details: Cues for posture and safety with turning Gait Pattern: Step-to pattern    Exercises Total Joint Exercises Quad Sets:  AROM;Right;10 reps Gluteal Sets: AROM;Both;10 reps Heel Slides: AAROM;Right;10 reps Hip ABduction/ADduction: AAROM;Right;10 reps Long Arc Quad: AROM;Right;10 reps   PT Diagnosis:    PT Problem List:   PT Treatment Interventions:     PT Goals (current goals can now be found in the care plan section) Acute Rehab PT Goals Patient Stated Goal: To walk.  To go home.  Visit Information  Last PT Received On: 04/01/13 Assistance Needed: +1 History of Present Illness: Patient is a 52 yo female s/p Rt. THA.  Patient reports she is to have her Lt hip replaced soon as well.    Subjective Data  Patient Stated Goal: To walk.  To go home.   Cognition  Cognition Arousal/Alertness: Awake/alert Behavior During Therapy: WFL for tasks assessed/performed Overall Cognitive Status: Within Functional Limits for tasks assessed    Balance  Balance Balance Assessed: Yes Static Standing Balance Static Standing - Level of Assistance: 5: Stand by assistance Static Standing - Comment/# of Minutes: x 10 mins with functional task  End of Session PT - End of Session Equipment Utilized During Treatment: Gait belt Activity Tolerance: Patient tolerated treatment well Patient left: in chair;with call bell/phone within reach Nurse Communication: Mobility status   GP     Fredrich Birks 04/01/2013, 11:51 AM 04/01/2013 Fredrich Birks PTA [phone] pager [phone] office

## 2013-04-01 NOTE — Progress Notes (Addendum)
Occupational Therapy Treatment Patient Details Name: [name] MRN: [id number] DOB: 05-22-1961 Today's Date: 04/01/2013 Time: 4098-1191 OT Time Calculation (min): 32 min  OT Assessment / Plan / Recommendation  OT comments  Pt progressing towards goals. Pt c/o bad night. Performed grooming at sink, toilet transfer, and educated on AE. Will practice LB dressing with AE and tub transfer next session.   Follow Up Recommendations  Home health OT;Supervision/Assistance - 24 hour    Barriers to Discharge       Equipment Recommendations  3 in 1 bedside comode;Tub/shower bench    Recommendations for Other Services    Frequency Min 2X/week   Progress towards OT Goals Progress towards OT goals: Progressing toward goals  Plan Discharge plan remains appropriate    Precautions / Restrictions Precautions Precautions: Posterior Hip Precaution Booklet Issued: No Precaution Comments: Reviewed precautions with patient. Cues to maintain with mobility Restrictions Weight Bearing Restrictions: Yes RLE Weight Bearing: Weight bearing as tolerated   Pertinent Vitals/Pain 8/10. Increased activity. Respositioned.    ADL  Grooming: Performed;Set up;Supervision/safety;Wash/dry face;Teeth care Where Assessed - Grooming: Supported standing Upper Body Bathing: Performed;Set up;Supervision/safety Where Assessed - Upper Body Bathing: Unsupported standing Lower Body Bathing: Supervision/safety;Set up (washed buttocks) Where Assessed - Lower Body Bathing: Supported standing Toilet Transfer: Performed;Min guard Statistician Method: Sit to Barista: Raised toilet seat with arms (or 3-in-1 over toilet) Toileting - Clothing Manipulation and Hygiene: Supervision/safety Where Assessed - Glass blower/designer Manipulation and Hygiene: Standing Equipment Used: Gait belt;Rolling walker;Sock aid;Reacher;Long-handled sponge;Long-handled shoe horn Transfers/Ambulation Related to ADLs:  Minguard for ambulation and Min A/Minguard for transfers ADL Comments: Pt performed grooming tasks at sink and washed off buttock and peri area. Pt able to stand a sink for extended period of time to complete tasks. OT educated on AE and showed to pt. Will practice this next session as well as tub transfer.    OT Diagnosis:    OT Problem List:   OT Treatment Interventions:     OT Goals(current goals can now be found in the care plan section) Acute Rehab OT Goals Patient Stated Goal: To walk.  To go home. OT Goal Formulation: With patient Time For Goal Achievement: 04/07/13 Potential to Achieve Goals: Good ADL Goals Pt Will Perform Grooming: with modified independence;standing Pt Will Perform Lower Body Bathing: with supervision;with adaptive equipment;sit to/from stand Pt Will Perform Lower Body Dressing: with supervision;with adaptive equipment;sit to/from stand Pt Will Transfer to Toilet: with modified independence;ambulating;bedside commode Pt Will Perform Toileting - Clothing Manipulation and hygiene: with modified independence;sit to/from stand Pt Will Perform Tub/Shower Transfer: Tub transfer;with supervision;ambulating;tub bench;3 in 1;rolling walker Additional ADL Goal #1: Pt will be able to verbalize and demonstrate 3/3 precautions independently.  Visit Information  Last OT Received On: 04/01/13 Assistance Needed: +1 PT/OT Co-Evaluation/Treatment: Yes History of Present Illness: Patient is a 52 yo female s/p Rt. THA.  Patient reports she is to have her Lt hip replaced soon as well.    Subjective Data      Prior Functioning       Cognition  Cognition Arousal/Alertness: Awake/alert Behavior During Therapy: WFL for tasks assessed/performed Overall Cognitive Status: Within Functional Limits for tasks assessed    Mobility  Bed Mobility Bed Mobility: Supine to Sit;Sitting - Scoot to Edge of Bed Supine to Sit: 4: Min assist Sitting - Scoot to Edge of Bed: 4: Min  guard Details for Bed Mobility Assistance: Verbal cues for technique.  Assist to move RLE off of  bed.  Transfers Transfers: Sit to Stand;Stand to Sit Sit to Stand: 4: Min assist;With upper extremity assist;From bed;4: Min guard;From chair/3-in-1 Stand to Sit: 4: Min guard;With upper extremity assist;To chair/3-in-1 Details for Transfer Assistance: [employer]ard for sit to stand from 3 in 1. Min A from recliner chair and also from bed. Cues to control decent to chair. Cues for hand placement and positioning of RLE.     Exercises         End of Session OT - End of Session Equipment Utilized During Treatment: Gait belt;Rolling walker Activity Tolerance: Patient tolerated treatment well Patient left: in chair;with call bell/phone within reach;with family/visitor present  GO     [name] OTR/L [phone] 04/01/2013, 10:30 AM

## 2013-04-02 LAB — CBC
HCT: 26.1 % — ABNORMAL LOW (ref 36.0–46.0)
MCV: 87.9 fL (ref 78.0–100.0)
RBC: 2.97 MIL/uL — ABNORMAL LOW (ref 3.87–5.11)
RDW: 14.7 % (ref 11.5–15.5)
WBC: 8.9 10*3/uL (ref 4.0–10.5)

## 2013-04-02 MED ORDER — ASPIRIN 325 MG PO TBEC: 325.0000 mg | DELAYED_RELEASE_TABLET | Freq: Two times a day (BID) | ORAL | Status: DC

## 2013-04-02 NOTE — Discharge Summary (Addendum)
Patient ID: [name] MRN: [id number] DOB/AGE: 1961-02-02 52 y.o.  Admit date: 03/31/2013 Discharge date: 04/03/13  Admission Diagnoses:  Principal Problem:   Osteoarthritis of right hip   Discharge Diagnoses:  Same  Past Medical History  Diagnosis Date  . Seasonal allergies   . Anxiety   . Depression   . Hypertension   . Complication of anesthesia     ? anesth. hemmorhaged after c section  . Arthritis     Surgeries: Procedure(s): TOTAL HIP ARTHROPLASTY on 03/31/2013   Consultants:    Discharged Condition: Improved  Hospital Course: [name] is an 52 y.o. female who was admitted 03/31/2013 for operative treatment ofOsteoarthritis of right hip. Patient has severe unremitting pain that affects sleep, daily activities, and work/hobbies. After pre-op clearance the patient was taken to the operating room on 03/31/2013 and underwent  Procedure(s): TOTAL HIP ARTHROPLASTY.    Patient was given perioperative antibiotics: Anti-infectives   Start     Dose/Rate Route Frequency Ordered Stop   03/31/13 0600  ceFAZolin (ANCEF) 3 g in dextrose 5 % 50 mL IVPB     3 g 160 mL/hr over 30 Minutes Intravenous On call to O.R. 03/30/13 1346 03/31/13 0735       Patient was given sequential compression devices, early ambulation, and chemoprophylaxis to prevent DVT.  Patient benefited maximally from hospital stay and there were no complications.    Recent vital signs: Patient Vitals for the past 24 hrs:  BP Temp Temp src Pulse Resp SpO2  04/02/13 0618 105/64 mmHg 98.3 F (36.8 C) - 80 16 100 %  04/02/13 0248 110/63 mmHg 98.4 F (36.9 C) - - - -  04/01/13 2143 110/58 mmHg - - - - -  04/01/13 2120 103/44 mmHg 99.1 F (37.3 C) - 88 16 97 %  04/01/13 1930 - 99.6 F (37.6 C) - - - -  04/01/13 1322 97/50 mmHg 99 F (37.2 C) Oral 92 18 98 %  04/01/13 0800 - - - - 16 98 %     Recent laboratory studies:  Recent Labs  04/01/13 0625 04/02/13 0525  WBC 11.3* 8.9  HGB 9.5*  8.6*  HCT 28.7* 26.1*  PLT 183 155  NA 138  --   K 3.6  --   CL 106  --   CO2 24  --   BUN 9  --   CREATININE 0.57  --   GLUCOSE 123*  --   CALCIUM 8.1*  --      Discharge Medications:     Medication List    STOP taking these medications       HYDROcodone-acetaminophen 10-650 MG per tablet  Commonly known as:  LORCET     naproxen 500 MG tablet  Commonly known as:  NAPROSYN      TAKE these medications       aspirin 325 MG EC tablet  Take 1 tablet (325 mg total) by mouth 2 (two) times daily.     escitalopram 10 MG tablet  Commonly known as:  LEXAPRO  Take 10 mg by mouth daily.     lisinopril 10 MG tablet  Commonly known as:  PRINIVIL,ZESTRIL  Take 10 mg by mouth daily.     methocarbamol 500 MG tablet  Commonly known as:  ROBAXIN  Take 1 tablet (500 mg total) by mouth every 6 (six) hours as needed.     oxyCODONE-acetaminophen 5-325 MG per tablet  Commonly known as:  ROXICET  Take 1-2 tablets  by mouth every 4 (four) hours as needed for pain.        Diagnostic Studies: Dg Chest 2 View  03/24/2013   *RADIOLOGY REPORT*  Clinical Data: Preoperative respiratory examination.  Planned total hip arthroplasty.  CHEST - 2 VIEW  Comparison: None.  Findings: The heart size and mediastinal contours are normal. The lungs are clear. There is no pleural effusion or pneumothorax. No acute osseous findings are identified.  There is symmetric prominence of the first costochondral margins.  IMPRESSION: No active cardiopulmonary process.   Original Report Authenticated By: Carey Bullocks, M.D.   Dg Pelvis Portable  03/31/2013   *RADIOLOGY REPORT*  Clinical Data: Osteoarthritis of the right hip.  PORTABLE PELVIS  Comparison: None.  Findings: The patient has undergone right total hip prosthesis insertion.  Acetabular and femoral components appear in excellent position in the AP projection.  No fractures.  Severe arthritis of the left hip is noted.  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   IMPRESSION: Satisfactory appearance of the right hip in the AP projection after total hip prosthesis insertion.   Original Report Authenticated By: Francene Boyers, M.D.   Dg Hip Portable 1 View Right  03/31/2013   *RADIOLOGY REPORT*  Clinical Data: Osteoarthritis of the right hip.  PORTABLE RIGHT HIP - 1 VIEW  Comparison: Radiographs dated 11/18/2010  Findings: The patient has undergone right total hip prosthesis insertion.  The femoral component is well seen on the lateral view and appears in good position with no fracture.  Acetabular component is obscured by the soft tissues.  IMPRESSION: Satisfactory appearance of the femoral prosthesis in the lateral projection.   Original Report Authenticated By: Francene Boyers, M.D.    Disposition:       Discharge Orders   Future Orders Complete By Expires     Call MD for:  redness, tenderness, or signs of infection (pain, swelling, redness, odor or green/yellow discharge around incision site)  As directed     Call MD for:  severe uncontrolled pain  As directed     Call MD for:  temperature >100.4  As directed     Change dressing (specify)  As directed     Comments:      Dressing change as needed.    Discharge instructions  As directed     Comments:      Follow up with Dr. Turner Daniels as scheduled.    Driving Restrictions  As directed     Comments:      No driving for 2 weeks.    Increase activity slowly  As directed     May shower / Bathe  As directed     Walker   As directed           Signed: Ravonda Brecheen M. 04/02/2013, 7:54 AM

## 2013-04-02 NOTE — Progress Notes (Addendum)
Occupational Therapy Treatment Patient Details Name: [name] MRN: [id number] DOB: [birth year] Today's Date: 04/02/2013 Time: 9528-4132 OT Time Calculation (min): 29 min  OT Assessment / Plan / Recommendation  OT comments  Pt progressing towards goals. Pt performed grooming at sink, toileting, and practiced with AE for LB ADLs. OT gave pt AE from supply. Pt needs to practice tub transfer tomorrow before d/c (will have tub bench she is borrowing).   Follow Up Recommendations  Home health OT;Supervision/Assistance - 24 hour    Barriers to Discharge       Equipment Recommendations  3 in 1 bedside comode;Tub/shower bench    Recommendations for Other Services    Frequency Min 2X/week   Progress towards OT Goals Progress towards OT goals: Progressing toward goals  Plan Discharge plan remains appropriate    Precautions / Restrictions Precautions Precautions: Posterior Hip Precaution Booklet Issued: No Precaution Comments: Pt able to state 1/3 precautions.  Cues to maintain during session. Restrictions Weight Bearing Restrictions: Yes RLE Weight Bearing: Weight bearing as tolerated   Pertinent Vitals/Pain 8/10 pain in hip. Repositioned.     ADL  Grooming: Performed;Set up;Supervision/safety;Wash/dry hands;Wash/dry face Where Assessed - Grooming: Unsupported standing;Supported standing Lower Body Bathing: Supervision/safety;Set up Where Assessed - Lower Body Bathing: Supported standing Lower Body Dressing: Minimal assistance Where Assessed - Lower Body Dressing: Unsupported sitting Toilet Transfer: Performed;Min guard Toilet Transfer Method: Sit to Barista: Raised toilet seat with arms (or 3-in-1 over toilet) Toileting - Clothing Manipulation and Hygiene: Supervision/safety Where Assessed - Glass blower/designer Manipulation and Hygiene: Standing Equipment Used: Gait belt;Rolling walker;Sock aid;Reacher;Long-handled sponge;Long-handled shoe  horn Transfers/Ambulation Related to ADLs: Minguard level for ambulation and transfers. ADL Comments: Pt performed grooming at sink. Cues to maintain precautions and to step closer to sink. Pt also washed peri area and buttocks. Pt practiced donning/doffing socks with reacher and sockaid. Pt also practiced donning panties with reacher at Min A but did not pull up due to bandage (pt requesting not to do this). OT educated to have chair or bed behind her when pulling up underwear/pants and have walker in front and someone with her for safety/balance. OT gave pt AE kit from supply. Showed pt and explained use of long handled sponge, shoe horn, sockaid, and reacher. Pt needs to practice tub transfer next session.    OT Diagnosis:    OT Problem List:   OT Treatment Interventions:     OT Goals(current goals can now be found in the care plan section) Acute Rehab OT Goals Patient Stated Goal: To walk.  To go home. OT Goal Formulation: With patient Time For Goal Achievement: 04/07/13 Potential to Achieve Goals: Good ADL Goals Pt Will Perform Grooming: with modified independence;standing Pt Will Perform Lower Body Bathing: with supervision;with adaptive equipment;sit to/from stand Pt Will Perform Lower Body Dressing: with supervision;with adaptive equipment;sit to/from stand Pt Will Transfer to Toilet: with modified independence;ambulating;bedside commode Pt Will Perform Toileting - Clothing Manipulation and hygiene: with modified independence;sit to/from stand Pt Will Perform Tub/Shower Transfer: Tub transfer;with supervision;ambulating;tub bench;3 in 1;rolling walker Additional ADL Goal #1: Pt will be able to verbalize and demonstrate 3/3 precautions independently.  Visit Information  Last OT Received On: 04/02/13 Assistance Needed: +1 History of Present Illness: Patient is a 52 yo female s/p Rt. THA.  Patient reports she is to have her Lt hip replaced soon as well.    Subjective Data       Prior Functioning  Cognition  Cognition Arousal/Alertness: Awake/alert Behavior During Therapy: WFL for tasks assessed/performed Overall Cognitive Status: Within Functional Limits for tasks assessed    Mobility  Bed Mobility Bed Mobility: Not assessed Transfers Transfers: Sit to Stand;Stand to Sit Sit to Stand: 4: Min guard;With upper extremity assist;From chair/3-in-1 Stand to Sit: 4: Min guard;With upper extremity assist;To chair/3-in-1 Details for Transfer Assistance: cues to control decent as pt plopped in chair. Cues for positioning of RLE during transfers.    Exercises      Balance     End of Session OT - End of Session Equipment Utilized During Treatment: Gait belt;Rolling walker Activity Tolerance: Patient tolerated treatment well Patient left: in chair;with call bell/phone within reach;with family/visitor present  GO     [name] OTR/L [phone] 04/02/2013, 3:47 PM

## 2013-04-02 NOTE — Progress Notes (Signed)
PATIENT ID: [name]  MRN: [id number]  DOB/AGE:  [birth year] / 52 y.o.  2 Days Post-Op Procedure(s) (LRB): TOTAL HIP ARTHROPLASTY (Right)    PROGRESS NOTE Subjective: Patient is alert, oriented,no Nausea, no Vomiting, yes passing gas, no Bowel Movement. Taking PO well. Denies SOB, Chest or Calf Pain. Using Incentive Spirometer, PAS in place. Ambulating well with pt Patient reports pain as moderate  .    Objective: Vital signs in last 24 hours: Filed Vitals:   04/01/13 2120 04/01/13 2143 04/02/13 0248 04/02/13 0618  BP: 103/44 110/58 110/63 105/64  Pulse: 88   80  Temp: 99.1 F (37.3 C)  98.4 F (36.9 C) 98.3 F (36.8 C)  TempSrc:      Resp: 16   16  SpO2: 97%   100%      Intake/Output from previous day: I/O last 3 completed shifts: In: 3560 [P.O.:560; I.V.:3000] Out: 2200 [Urine:2200]   Intake/Output this shift:     LABORATORY DATA:  Recent Labs  04/01/13 0625 04/02/13 0525  WBC 11.3* 8.9  HGB 9.5* 8.6*  HCT 28.7* 26.1*  PLT 183 155  NA 138  --   K 3.6  --   CL 106  --   CO2 24  --   BUN 9  --   CREATININE 0.57  --   GLUCOSE 123*  --   CALCIUM 8.1*  --     Examination: Neurologically intact ABD soft Neurovascular intact Sensation intact distally Intact pulses distally Dorsiflexion/Plantar flexion intact Incision: scant drainage} XR AP&Lat of hip shows well placed\fixed THA  Assessment:   2 Days Post-Op Procedure(s) (LRB): TOTAL HIP ARTHROPLASTY (Right) ADDITIONAL DIAGNOSIS:  Acute Blood Loss Anemia - minimally symptomatic  Plan: PT/OT WBAT, THA  posterior precautions  DVT Prophylaxis: SCDx72 hrs, ASA 325 mg BID x 2 weeks  DISCHARGE PLAN: Home today if pt goals met  DISCHARGE NEEDS: HHPT, HHRN, Walker and 3-in-1 comode seat

## 2013-04-02 NOTE — Progress Notes (Signed)
Physical Therapy Treatment Patient Details Name: CAYLI ESCAJEDA MRN: [id number] DOB: [birth year] Today's Date: 04/02/2013 Time: 0820-0859 PT Time Calculation (min): 39 min  PT Assessment / Plan / Recommendation  PT Comments   Patient progressing slowly this morning. Limited by soreness and stiffness. Plans to DC home tomorrow instead of today. COntinue with current POC  Follow Up Recommendations  Home health PT;Supervision/Assistance - 24 hour     Does the patient have the potential to tolerate intense rehabilitation     Barriers to Discharge        Equipment Recommendations  Rolling walker with 5" wheels;3in1 (PT)    Recommendations for Other Services    Frequency 7X/week   Progress towards PT Goals Progress towards PT goals: Progressing toward goals  Plan Current plan remains appropriate    Precautions / Restrictions Precautions Precautions: Posterior Hip Precaution Comments: Reviewed precautions with patient. Cues to maintain with mobility Restrictions Weight Bearing Restrictions: Yes RLE Weight Bearing: Weight bearing as tolerated   Pertinent Vitals/Pain 8/10 R LE pain. RN provided medication to assist with pain control     Mobility  Bed Mobility Supine to Sit: 4: Min assist Sitting - Scoot to Edge of Bed: 4: Min guard Details for Bed Mobility Assistance: Verbal cues for technique.  Assist to move RLE off of bed. Daughter assisted with lowering leg off of bed Transfers Sit to Stand: 4: Min guard;With upper extremity assist;From bed Stand to Sit: 4: Min guard;With upper extremity assist;To chair/3-in-1 Details for Transfer Assistance: Cues for safe technique Ambulation/Gait Ambulation/Gait Assistance: 4: Min guard Ambulation Distance (Feet): 80 Feet Assistive device: Rolling walker Ambulation/Gait Assistance Details: Cues for upright posture and not to lean over RW Gait Pattern: Step-to pattern;Antalgic Gait velocity: decreased    Exercises Total Joint  Exercises Quad Sets: AROM;Right;10 reps Gluteal Sets: AROM;Both;10 reps Heel Slides: AAROM;Right;10 reps Hip ABduction/ADduction: AAROM;Right;10 reps Long Arc Quad: AROM;Right;10 reps   PT Diagnosis:    PT Problem List:   PT Treatment Interventions:     PT Goals (current goals can now be found in the care plan section)    Visit Information  Last PT Received On: 04/02/13 Assistance Needed: +1 History of Present Illness: Patient is a 52 yo female s/p Rt. THA.  Patient reports she is to have her Lt hip replaced soon as well.    Subjective Data      Cognition  Cognition Arousal/Alertness: Awake/alert Behavior During Therapy: WFL for tasks assessed/performed Overall Cognitive Status: Within Functional Limits for tasks assessed    Balance     End of Session PT - End of Session Equipment Utilized During Treatment: Gait belt Activity Tolerance: Patient tolerated treatment well Patient left: in chair;with call bell/phone within reach Nurse Communication: Mobility status   GP     Fredrich Birks 04/02/2013, 11:35 AM 04/02/2013 Fredrich Birks PTA [phone] pager [phone] office

## 2013-04-02 NOTE — Progress Notes (Signed)
Physical Therapy Treatment Patient Details Name: [name] MRN: [id number] DOB: December 30, 1960 Today's Date: 04/02/2013 Time: 4098-1191 PT Time Calculation (min): 9 min  PT Assessment / Plan / Recommendation  PT Comments   Patient able to increase ambulation distance and speed this afternoon. Patient declined therex after ambulation due to fatique. Continue to increase ambulation and work on steps tomorrow prior to DC home  Follow Up Recommendations  Home health PT;Supervision/Assistance - 24 hour     Does the patient have the potential to tolerate intense rehabilitation     Barriers to Discharge        Equipment Recommendations  Rolling walker with 5" wheels;3in1 (PT)    Recommendations for Other Services    Frequency 7X/week   Progress towards PT Goals Progress towards PT goals: Progressing toward goals  Plan Current plan remains appropriate    Precautions / Restrictions Precautions Precautions: Posterior Hip Precaution Comments: Reviewed precautions with patient. Cues to maintain with mobility Restrictions Weight Bearing Restrictions: Yes RLE Weight Bearing: Weight bearing as tolerated   Pertinent Vitals/Pain 8/10 R hip. patient repositioned for comfort     Mobility  Bed Mobility Supine to Sit: 4: Min assist Sitting - Scoot to Edge of Bed: 4: Min guard Details for Bed Mobility Assistance: Verbal cues for technique.  Assist to move RLE off of bed. Daughter assisted with lowering leg off of bed Transfers Sit to Stand: 5: Supervision;With upper extremity assist;From chair/3-in-1 Stand to Sit: 5: Supervision;With upper extremity assist;To chair/3-in-1 Details for Transfer Assistance: Cues to sit slowly.  Ambulation/Gait Ambulation/Gait Assistance: 4: Min guard Ambulation Distance (Feet): 150 Feet Assistive device: Rolling walker Ambulation/Gait Assistance Details: Cues to not lean heavily on UEs and lean over RW Gait Pattern: Step-to pattern Gait velocity:  increasing     Exercises Total Joint Exercises Quad Sets: AROM;Right;10 reps Gluteal Sets: AROM;Both;10 reps Heel Slides: AAROM;Right;10 reps Hip ABduction/ADduction: AAROM;Right;10 reps Long Arc Quad: AROM;Right;10 reps   PT Diagnosis:    PT Problem List:   PT Treatment Interventions:     PT Goals (current goals can now be found in the care plan section)    Visit Information  Last PT Received On: 04/02/13 Assistance Needed: +1 History of Present Illness: Patient is a 52 yo female s/p Rt. THA.  Patient reports she is to have her Lt hip replaced soon as well.    Subjective Data      Cognition  Cognition Arousal/Alertness: Awake/alert Behavior During Therapy: WFL for tasks assessed/performed Overall Cognitive Status: Within Functional Limits for tasks assessed    Balance     End of Session PT - End of Session Equipment Utilized During Treatment: Gait belt Activity Tolerance: Patient tolerated treatment well Patient left: in chair;with call bell/phone within reach Nurse Communication: Mobility status   GP     Fredrich Birks 04/02/2013, 12:00 PM 04/02/2013 Fredrich Birks PTA [phone] pager [phone] office

## 2013-04-03 LAB — CBC
HCT: 25.4 % — ABNORMAL LOW (ref 36.0–46.0)
Hemoglobin: 8.2 g/dL — ABNORMAL LOW (ref 12.0–15.0)
MCH: 28.5 pg (ref 26.0–34.0)
MCHC: 32.3 g/dL (ref 30.0–36.0)

## 2013-04-03 MED ORDER — PNEUMOCOCCAL VAC POLYVALENT 25 MCG/0.5ML IJ INJ
0.5000 mL | INJECTION | INTRAMUSCULAR | Status: AC
  Administered 2013-04-03: 0.5 mL via INTRAMUSCULAR

## 2013-04-03 NOTE — Progress Notes (Signed)
Physical Therapy Treatment Patient Details Name: [name] MRN: [id number] DOB: 05-Aug-1961 Today's Date: 04/03/2013 Time: 7829-5621 PT Time Calculation (min): 24 min  PT Assessment / Plan / Recommendation  PT Comments   Patient able to tolerate stair training this afternoon. Planning to DC home later today  Follow Up Recommendations  Home health PT;Supervision/Assistance - 24 hour     Does the patient have the potential to tolerate intense rehabilitation     Barriers to Discharge        Equipment Recommendations  Rolling walker with 5" wheels;3in1 (PT)    Recommendations for Other Services    Frequency 7X/week   Progress towards PT Goals Progress towards PT goals: Progressing toward goals  Plan Current plan remains appropriate    Precautions / Restrictions Precautions Precautions: Posterior Hip Precaution Comments: Patient able to state all precautions.  Restrictions RLE Weight Bearing: Weight bearing as tolerated   Pertinent Vitals/Pain no apparent distress RN provided medication to assist with pain control     Mobility  Bed Mobility Bed Mobility: Not assessed Transfers Sit to Stand: 5: Supervision;From bed;From chair/3-in-1;With upper extremity assist Stand to Sit: With upper extremity assist;To chair/3-in-1;5: Supervision Details for Transfer Assistance: Supervision for safety Ambulation/Gait Ambulation/Gait Assistance: 5: Supervision Ambulation Distance (Feet): 120 Feet Assistive device: Rolling walker Gait Pattern: Step-to pattern Stairs: Yes Stairs Assistance: 4: Min assist Stairs Assistance Details (indicate cue type and reason): A for RW. CUes for seqency and technique. Handout provided Stair Management Technique: Backwards;No rails;Step to pattern;With walker Number of Stairs: 2    Exercises Total Joint Exercises Quad Sets: AROM;Right;10 reps Gluteal Sets: AROM;Both;10 reps Heel Slides: AAROM;Right;10 reps Hip ABduction/ADduction:  AAROM;Right;10 reps Long Arc Quad: AROM;Right;10 reps   PT Diagnosis:    PT Problem List:   PT Treatment Interventions:     PT Goals (current goals can now be found in the care plan section)    Visit Information  Last PT Received On: 04/03/13 Assistance Needed: +1 History of Present Illness: Patient is a 52 yo female s/p Rt. THA.  Patient reports she is to have her Lt hip replaced soon as well.    Subjective Data      Cognition  Cognition Arousal/Alertness: Awake/alert Behavior During Therapy: WFL for tasks assessed/performed    Balance     End of Session PT - End of Session Equipment Utilized During Treatment: Gait belt Activity Tolerance: Patient tolerated treatment well Patient left: in chair;with call bell/phone within reach   GP     Robinette, Adline Potter 04/03/2013, 1:58 PM  04/03/2013 Fredrich Birks PTA [phone] pager [phone] office

## 2013-04-03 NOTE — Progress Notes (Signed)
Patient ID: [name], female   DOB: [birth year], 52 y.o.   MRN: [id number] PATIENT ID: [name]  MRN: [id number]  DOB/AGE:  20-Feb-1961 / 52 y.o.  3 Days Post-Op Procedure(s) (LRB): TOTAL HIP ARTHROPLASTY (Right)    PROGRESS NOTE Subjective: Patient is alert, oriented,no Nausea, no Vomiting, yes passing gas, no Bowel Movement. Taking PO well. Denies SOB, Chest or Calf Pain. Using Incentive Spirometer, PAS in place. Ambulate WBAT, walked 150 feet yesterday, will attempt steps today. Patient reports pain as moderate  .    Objective: Vital signs in last 24 hours: Filed Vitals:   04/02/13 0248 04/02/13 0618 04/02/13 1956 04/03/13 0613  BP: 110/63 105/64 105/49 90/46  Pulse:  80 100 88  Temp: 98.4 F (36.9 C) 98.3 F (36.8 C) 99 F (37.2 C) 98.9 F (37.2 C)  TempSrc:   Oral Oral  Resp:  16 16 16   SpO2:  100% 98% 97%      Intake/Output from previous day: I/O last 3 completed shifts: In: 240 [P.O.:240] Out: -    Intake/Output this shift:     LABORATORY DATA:  Recent Labs  04/01/13 0625 04/02/13 0525 04/03/13 0544  WBC 11.3* 8.9 9.0  HGB 9.5* 8.6* 8.2*  HCT 28.7* 26.1* 25.4*  PLT 183 155 169  NA 138  --   --   K 3.6  --   --   CL 106  --   --   CO2 24  --   --   BUN 9  --   --   CREATININE 0.57  --   --   GLUCOSE 123*  --   --   CALCIUM 8.1*  --   --     Examination: Neurologically intact ABD soft Neurovascular intact Sensation intact distally Intact pulses distally Dorsiflexion/Plantar flexion intact Incision: no drainage No cellulitis present Compartment soft} XR AP&Lat of hip shows well placed\fixed THA  Assessment:   3 Days Post-Op Procedure(s) (LRB): TOTAL HIP ARTHROPLASTY (Right) ADDITIONAL DIAGNOSIS:  Hypertension and morbid obesity  Plan: PT/OT WBAT, THA  posterior precautions  DVT Prophylaxis: SCDx72 hrs, ASA 325 mg BID x 2 weeks  DISCHARGE PLAN: Discharge home today if passes PT, If not will consult social for  SNF.  DISCHARGE NEEDS: HHPT, HHRN, Walker and 3-in-1 comode seat

## 2013-04-03 NOTE — Progress Notes (Signed)
Physical Therapy Treatment Patient Details Name: LADYE MACNAUGHTON MRN: [id number] DOB: [birth year] Today's Date: 04/03/2013 Time: 7829-5621 PT Time Calculation (min): 28 min  PT Assessment / Plan / Recommendation  PT Comments   Patient is making good progress with PT.  From a mobility standpoint anticipate patient will be ready for DC home today after stair training. Patient continues to need max encouragement as she comes up with reasons not to complete a task. She has assistance set up at home and is at a safe DC level based on goals set on eval   Follow Up Recommendations  Home health PT;Supervision/Assistance - 24 hour     Does the patient have the potential to tolerate intense rehabilitation     Barriers to Discharge        Equipment Recommendations  Rolling walker with 5" wheels;3in1 (PT)    Recommendations for Other Services    Frequency 7X/week   Progress towards PT Goals Progress towards PT goals: Progressing toward goals  Plan Current plan remains appropriate    Precautions / Restrictions Precautions Precautions: Posterior Hip Precaution Comments: Patient able to state all precautions.  Restrictions RLE Weight Bearing: Weight bearing as tolerated   Pertinent Vitals/Pain 8/10 R hip pain. RN provided medication to assist with pain control     Mobility  Bed Mobility Supine to Sit: 4: Min assist Details for Bed Mobility Assistance: Verbal cues for technique.  Assist to move RLE off of bed.  Transfers Sit to Stand: 5: Supervision;From bed;From chair/3-in-1;With upper extremity assist Stand to Sit: With upper extremity assist;To chair/3-in-1 Details for Transfer Assistance: Supervision for safety Ambulation/Gait Ambulation/Gait Assistance: 5: Supervision Ambulation Distance (Feet): 150 Feet Assistive device: Rolling walker Ambulation/Gait Assistance Details: Cues to not keep walker out too far in front of her.  Gait Pattern: Step-to pattern Gait velocity:  decreased this AM from yesterday    Exercises     PT Diagnosis:    PT Problem List:   PT Treatment Interventions:     PT Goals (current goals can now be found in the care plan section)    Visit Information  Last PT Received On: 04/03/13 Assistance Needed: +1 History of Present Illness: Patient is a 52 yo female s/p Rt. THA.  Patient reports she is to have her Lt hip replaced soon as well.    Subjective Data      Cognition  Cognition Arousal/Alertness: Awake/alert Behavior During Therapy: WFL for tasks assessed/performed Overall Cognitive Status: Within Functional Limits for tasks assessed    Balance     End of Session PT - End of Session Equipment Utilized During Treatment: Gait belt Activity Tolerance: Patient tolerated treatment well Patient left: in chair;with call bell/phone within reach Nurse Communication: Mobility status   GP     Fredrich Birks 04/03/2013, 8:18 AM  04/03/2013 Fredrich Birks PTA [phone] pager [phone] office

## 2013-05-28 ENCOUNTER — Ambulatory Visit: Payer: Medicaid Other | Admitting: Physical Therapy

## 2013-06-04 ENCOUNTER — Ambulatory Visit: Payer: Medicaid Other | Admitting: Physical Therapy

## 2013-08-05 ENCOUNTER — Ambulatory Visit: Payer: Medicaid Other | Attending: Orthopedic Surgery | Admitting: Physical Therapy

## 2014-03-19 ENCOUNTER — Other Ambulatory Visit: Payer: Self-pay | Admitting: Orthopedic Surgery

## 2014-04-08 ENCOUNTER — Encounter (HOSPITAL_COMMUNITY): Payer: Self-pay | Admitting: Pharmacy Technician

## 2014-04-09 ENCOUNTER — Encounter (HOSPITAL_COMMUNITY)
Admission: RE | Admit: 2014-04-09 | Discharge: 2014-04-09 | Disposition: A | Discharge status: Home / Self Care | Payer: Medicaid Other | Source: Ambulatory Visit | Attending: Orthopedic Surgery | Admitting: Orthopedic Surgery

## 2014-04-09 ENCOUNTER — Encounter (HOSPITAL_COMMUNITY): Payer: Self-pay

## 2014-04-09 ENCOUNTER — Ambulatory Visit (HOSPITAL_COMMUNITY)
Admission: RE | Admit: 2014-04-09 | Discharge: 2014-04-09 | Disposition: A | Discharge status: Home / Self Care | Payer: Medicaid Other | Source: Ambulatory Visit | Attending: Orthopedic Surgery | Admitting: Orthopedic Surgery

## 2014-04-09 DIAGNOSIS — Z01818 Encounter for other preprocedural examination: Secondary | ICD-10-CM | POA: Diagnosis not present

## 2014-04-09 LAB — CBC WITH DIFFERENTIAL/PLATELET
BASOS PCT: 0 % (ref 0–1)
Basophils Absolute: 0 10*3/uL (ref 0.0–0.1)
EOS PCT: 2 % (ref 0–5)
Eosinophils Absolute: 0.1 10*3/uL (ref 0.0–0.7)
HEMATOCRIT: 41.4 % (ref 36.0–46.0)
Hemoglobin: 13.2 g/dL (ref 12.0–15.0)
LYMPHS PCT: 23 % (ref 12–46)
Lymphs Abs: 1.8 10*3/uL (ref 0.7–4.0)
MCH: 28.8 pg (ref 26.0–34.0)
MCHC: 31.9 g/dL (ref 30.0–36.0)
MCV: 90.2 fL (ref 78.0–100.0)
MONO ABS: 0.4 10*3/uL (ref 0.1–1.0)
Monocytes Relative: 5 % (ref 3–12)
NEUTROS ABS: 5.5 10*3/uL (ref 1.7–7.7)
Neutrophils Relative %: 70 % (ref 43–77)
PLATELETS: 233 10*3/uL (ref 150–400)
RBC: 4.59 MIL/uL (ref 3.87–5.11)
RDW: 14.3 % (ref 11.5–15.5)
WBC: 7.8 10*3/uL (ref 4.0–10.5)

## 2014-04-09 LAB — BASIC METABOLIC PANEL
ANION GAP: 14 (ref 5–15)
BUN: 17 mg/dL (ref 6–23)
CO2: 26 mEq/L (ref 19–32)
Calcium: 9.1 mg/dL (ref 8.4–10.5)
Chloride: 102 mEq/L (ref 96–112)
Creatinine, Ser: 0.69 mg/dL (ref 0.50–1.10)
GFR calc Af Amer: >90 mL/min (ref >90)
Glucose, Bld: 91 mg/dL (ref 70–99)
Potassium: 4.3 mEq/L (ref 3.7–5.3)
SODIUM: 142 meq/L (ref 137–147)

## 2014-04-09 LAB — TYPE AND SCREEN
ABO/RH(D): O POS
Antibody Screen: NEGATIVE

## 2014-04-09 LAB — URINALYSIS, ROUTINE W REFLEX MICROSCOPIC
GLUCOSE, UA: NEGATIVE mg/dL
HGB URINE DIPSTICK: NEGATIVE
Ketones, ur: 15 mg/dL — AB
Leukocytes, UA: NEGATIVE
Nitrite: NEGATIVE
PH: 5.5 (ref 5.0–8.0)
Protein, ur: NEGATIVE mg/dL
SPECIFIC GRAVITY, URINE: 1.034 — AB (ref 1.005–1.030)
Urobilinogen, UA: 0.2 mg/dL (ref 0.0–1.0)

## 2014-04-09 LAB — APTT: APTT: 24 s (ref 24–37)

## 2014-04-09 LAB — PROTIME-INR
INR: 0.97 (ref 0.00–1.49)
Prothrombin Time: 12.9 seconds (ref 11.6–15.2)

## 2014-04-09 LAB — SURGICAL PCR SCREEN
MRSA, PCR: NEGATIVE
STAPHYLOCOCCUS AUREUS: NEGATIVE

## 2014-04-09 MED ORDER — CHLORHEXIDINE GLUCONATE 4 % EX LIQD: 60.0000 mL | Freq: Once | CUTANEOUS | Status: DC

## 2014-04-09 NOTE — Pre-Procedure Instructions (Signed)
Rishita Petron Pagliaro  04/09/2014   Your procedure is scheduled on: Monday, April 20, 2014  Report to [facility] Admitting at 5:30 AM.  Call this number if you have problems the morning of surgery: [phone]   Remember:   Do not eat food or drink liquids after midnight Sunday, April 19, 2014   Take these medicines the morning of surgery with A SIP OF WATER: escitalopram (LEXAPRO), if needed: HYDROcodone (NORCO/VICODIN) for moderate pain. Stop taking Aspirin, vitamins, and herbal medications. Do not take any NSAIDs ie: Ibuprofen, Advil, Naproxen or any medication containing Aspirin ( Aspirin-Salicylamide-Caffeine (BC HEADACHE POWDER PO); Stop 7 days prior to procedure (Mon. 04/13/14).  Do not wear jewelry, make-up or nail polish.  Do not wear lotions, powders, or perfumes. You may wear deodorant.  Do not shave 48 hours prior to surgery.   Do not bring valuables to the hospital.  [facility] is not responsible for any belongings or valuables.               Contacts, dentures or bridgework may not be worn into surgery.  Leave suitcase in the car. After surgery it may be brought to your room.  For patients admitted to the hospital, discharge time is determined by your treatment team.               Patients discharged the day of surgery will not be allowed to drive home.  Name and phone number of your driver:   Special Instructions:  Special Instructions:Special Instructions: [facility] - Preparing for Surgery  Before surgery, you can play an important role.  Because skin is not sterile, your skin needs to be as free of germs as possible.  You can reduce the number of germs on you skin by washing with CHG (chlorahexidine gluconate) soap before surgery.  CHG is an antiseptic cleaner which kills germs and bonds with the skin to continue killing germs even after washing.  Please DO NOT use if you have an allergy to CHG or antibacterial soaps.  If your skin becomes reddened/irritated stop  using the CHG and inform your nurse when you arrive at Short Stay.  Do not shave (including legs and underarms) for at least 48 hours prior to the first CHG shower.  You may shave your face.  Please follow these instructions carefully:   1.  Shower with CHG Soap the night before surgery and the morning of Surgery.  2.  If you choose to wash your hair, wash your hair first as usual with your normal shampoo.  3.  After you shampoo, rinse your hair and body thoroughly to remove the Shampoo.  4.  Use CHG as you would any other liquid soap.  You can apply chg directly  to the skin and wash gently with scrungie or a clean washcloth.  5.  Apply the CHG Soap to your body ONLY FROM THE NECK DOWN.  Do not use on open wounds or open sores.  Avoid contact with your eyes, ears, mouth and genitals (private parts).  Wash genitals (private parts) with your normal soap.  6.  Wash thoroughly, paying special attention to the area where your surgery will be performed.  7.  Thoroughly rinse your body with warm water from the neck down.  8.  DO NOT shower/wash with your normal soap after using and rinsing off the CHG Soap.  9.  Pat yourself dry with a clean towel.  10.  Wear clean pajamas.            11.  Place clean sheets on your bed the night of your first shower and do not sleep with pets.  Day of Surgery  Do not apply any lotions the morning of surgery.  Please wear clean clothes to the hospital/surgery center.   Please read over the following fact sheets that you were given: Pain Booklet, Coughing and Deep Breathing, Blood Transfusion Information, MRSA Information and Surgical Site Infection Prevention

## 2014-04-19 DIAGNOSIS — M1612 Unilateral primary osteoarthritis, left hip: Secondary | ICD-10-CM | POA: Diagnosis present

## 2014-04-19 MED ORDER — TRANEXAMIC ACID 100 MG/ML IV SOLN
1000.0000 mg | INTRAVENOUS | Status: AC
  Administered 2014-04-20: 1000 mg via INTRAVENOUS
  Filled 2014-04-19: qty 10

## 2014-04-19 MED ORDER — CEFAZOLIN SODIUM-DEXTROSE 2-3 GM-% IV SOLR
2.0000 g | INTRAVENOUS | Status: AC
  Administered 2014-04-20: 3 g via INTRAVENOUS
  Filled 2014-04-19: qty 50

## 2014-04-19 NOTE — H&P (Signed)
TOTAL HIP ADMISSION H&P  Patient is admitted for left total hip arthroplasty.  Subjective:  Chief Complaint: left hip pain  HPI: [name], 53 y.o. female, has a history of pain and functional disability in the left hip(s) due to arthritis and patient has failed non-surgical conservative treatments for greater than 12 weeks to include NSAID's and/or analgesics, flexibility and strengthening excercises, use of assistive devices, weight reduction as appropriate and activity modification.  Onset of symptoms was gradual starting several years ago with gradually worsening course since that time.The patient noted no past surgery on the left hip(s).  Patient currently rates pain in the left hip at 10 out of 10 with activity. Patient has night pain, worsening of pain with activity and weight bearing, pain that interfers with activities of daily living and pain with passive range of motion. Patient has evidence of joint space narrowing by imaging studies. This condition presents safety issues increasing the risk of falls.   There is no current active infection.  Patient Active Problem List   Diagnosis Date Noted  . Osteoarthritis of right hip 08/09/2012  . Osteoarthritis of both knees 08/09/2012  . Anxiety and depression 08/09/2012  . ARTHRITIS, HIPS, BILATERAL 11/22/2010  . PAIN IN JOINT, MULTIPLE SITES 03/02/2010  . URI 08/27/2009  . SINUSITIS 07/01/2009  . LEG PAIN, RIGHT 06/24/2009  . TACHYCARDIA 06/24/2009  . ALLERGIC RHINITIS 01/20/2009  . HYPERCHOLESTEROLEMIA 10/05/2008  . CANDIDIASIS 10/01/2008  . FIBROIDS, UTERUS 10/01/2008  . MICROSCOPIC HEMATURIA 10/01/2008  . OBESITY 06/17/2008  . ANXIETY DISORDER 06/17/2008  . TOBACCO ABUSE 06/17/2008  . HYPERTENSION, BENIGN ESSENTIAL 09/25/2006   Past Medical History  Diagnosis Date  . Seasonal allergies   . Anxiety   . Depression   . Hypertension   . Complication of anesthesia     ? anesth. hemmorhaged after c section  . Arthritis      Past Surgical History  Procedure Laterality Date  . Cesarean section    . Colonoscopy    . Polypectomy    . Total hip arthroplasty Right 03/31/2013    Procedure: TOTAL HIP ARTHROPLASTY;  Surgeon: Kerin Salen, MD;  Location: [facility];  Service: Orthopedics;  Laterality: Right;  DEPUY/SROM RIGHT TOTAL HIP ARTHROPLASTY    No prescriptions prior to admission   Allergies  Allergen Reactions  . Other     Patient does not do well with anesthesia.    History  Substance Use Topics  . Smoking status: Current Every Day Smoker -- 0.25 packs/day for 25 years    Types: Cigarettes  . Smokeless tobacco: Never Used  . Alcohol Use: 1.0 oz/week    2 drink(s) per week     Comment: social    Family History  Problem Relation Age of Onset  . Dementia Mother   . Hypertension Mother   . Diabetes Mother   . Dementia Father   . Heart disease Father      Review of Systems  Constitutional: Negative.   HENT: Negative.   Eyes: Negative.   Respiratory: Negative.   Cardiovascular: Negative.   Gastrointestinal: Negative.   Genitourinary: Negative.   Musculoskeletal: Positive for joint pain.  Skin: Negative.   Neurological: Negative.   Endo/Heme/Allergies: Negative.   Psychiatric/Behavioral: Negative.     Objective:  Physical Exam  Constitutional: She is oriented to person, place, and time. She appears well-developed and well-nourished.  HENT:  Head: Normocephalic and atraumatic.  Eyes: Pupils are equal, round, and reactive to light.  Neck:  Normal range of motion. Neck supple.  Cardiovascular: Intact distal pulses.   Respiratory: Effort normal.  Musculoskeletal: She exhibits tenderness.  The left hip is stuck in about 5 of external rotation with little if any, rotation either direction, foot tap, reproduces mild pain.    Neurological: She is alert and oriented to person, place, and time.  Skin: Skin is warm and dry.  Psychiatric: She has a normal mood and affect. Her behavior is  normal. Judgment and thought content normal.    Vital signs in last 24 hours:    Labs:   Estimated body mass index is 45.38 kg/(m^2) as calculated from the following:   Height as of 03/24/13: 5\' 2"  (1.575 m).   Weight as of 03/24/13: 112.583 kg (248 lb 3.2 oz).   Imaging Review Plain radiographs demonstrate severe degenerative joint disease of the left hip(s). The bone quality appears to be good for age and reported activity level.  Assessment/Plan:  End stage arthritis, left hip(s)  The patient history, physical examination, clinical judgement of the provider and imaging studies are consistent with end stage degenerative joint disease of the left hip(s) and total hip arthroplasty is deemed medically necessary. The treatment options including medical management, injection therapy, arthroscopy and arthroplasty were discussed at length. The risks and benefits of total hip arthroplasty were presented and reviewed. The risks due to aseptic loosening, infection, stiffness, dislocation/subluxation,  thromboembolic complications and other imponderables were discussed.  The patient acknowledged the explanation, agreed to proceed with the plan and consent was signed. Patient is being admitted for inpatient treatment for surgery, pain control, PT, OT, prophylactic antibiotics, VTE prophylaxis, progressive ambulation and ADL's and discharge planning.The patient is planning to be discharged to skilled nursing facility

## 2014-04-20 ENCOUNTER — Encounter (HOSPITAL_COMMUNITY): Payer: Self-pay | Admitting: Anesthesiology

## 2014-04-20 ENCOUNTER — Inpatient Hospital Stay (HOSPITAL_COMMUNITY): Payer: Medicaid Other

## 2014-04-20 ENCOUNTER — Inpatient Hospital Stay (HOSPITAL_COMMUNITY): Payer: Medicaid Other | Admitting: Anesthesiology

## 2014-04-20 ENCOUNTER — Encounter (HOSPITAL_COMMUNITY): Payer: Medicaid Other | Admitting: Anesthesiology

## 2014-04-20 ENCOUNTER — Inpatient Hospital Stay (HOSPITAL_COMMUNITY)
Admission: RE | Admit: 2014-04-20 | Discharge: 2014-04-24 | DRG: 470 | Disposition: A | Discharge status: Home Health | Payer: Medicaid Other | Source: Ambulatory Visit | Attending: Orthopedic Surgery | Admitting: Orthopedic Surgery

## 2014-04-20 ENCOUNTER — Encounter (HOSPITAL_COMMUNITY)
Admission: RE | Disposition: A | Discharge status: Home Health | Payer: Self-pay | Source: Ambulatory Visit | Attending: Orthopedic Surgery

## 2014-04-20 DIAGNOSIS — Z6841 Body Mass Index (BMI) 40.0 and over, adult: Secondary | ICD-10-CM | POA: Diagnosis not present

## 2014-04-20 DIAGNOSIS — Z7982 Long term (current) use of aspirin: Secondary | ICD-10-CM

## 2014-04-20 DIAGNOSIS — Z79899 Other long term (current) drug therapy: Secondary | ICD-10-CM

## 2014-04-20 DIAGNOSIS — Z791 Long term (current) use of non-steroidal anti-inflammatories (NSAID): Secondary | ICD-10-CM

## 2014-04-20 DIAGNOSIS — F3289 Other specified depressive episodes: Secondary | ICD-10-CM | POA: Diagnosis present

## 2014-04-20 DIAGNOSIS — F17200 Nicotine dependence, unspecified, uncomplicated: Secondary | ICD-10-CM | POA: Diagnosis present

## 2014-04-20 DIAGNOSIS — I10 Essential (primary) hypertension: Secondary | ICD-10-CM | POA: Diagnosis present

## 2014-04-20 DIAGNOSIS — K649 Unspecified hemorrhoids: Secondary | ICD-10-CM | POA: Diagnosis present

## 2014-04-20 DIAGNOSIS — Z96649 Presence of unspecified artificial hip joint: Secondary | ICD-10-CM | POA: Diagnosis not present

## 2014-04-20 DIAGNOSIS — M1710 Unilateral primary osteoarthritis, unspecified knee: Secondary | ICD-10-CM | POA: Diagnosis present

## 2014-04-20 DIAGNOSIS — F411 Generalized anxiety disorder: Secondary | ICD-10-CM | POA: Diagnosis present

## 2014-04-20 DIAGNOSIS — E7800 Pure hypercholesterolemia, unspecified: Secondary | ICD-10-CM | POA: Diagnosis present

## 2014-04-20 DIAGNOSIS — M169 Osteoarthritis of hip, unspecified: Principal | ICD-10-CM | POA: Diagnosis present

## 2014-04-20 DIAGNOSIS — M1610 Unilateral primary osteoarthritis, unspecified hip: Principal | ICD-10-CM | POA: Diagnosis present

## 2014-04-20 DIAGNOSIS — M1612 Unilateral primary osteoarthritis, left hip: Secondary | ICD-10-CM | POA: Diagnosis present

## 2014-04-20 DIAGNOSIS — Z833 Family history of diabetes mellitus: Secondary | ICD-10-CM | POA: Diagnosis not present

## 2014-04-20 DIAGNOSIS — M25559 Pain in unspecified hip: Secondary | ICD-10-CM | POA: Diagnosis present

## 2014-04-20 DIAGNOSIS — Z8249 Family history of ischemic heart disease and other diseases of the circulatory system: Secondary | ICD-10-CM | POA: Diagnosis not present

## 2014-04-20 DIAGNOSIS — F329 Major depressive disorder, single episode, unspecified: Secondary | ICD-10-CM | POA: Diagnosis present

## 2014-04-20 HISTORY — PX: TOTAL HIP ARTHROPLASTY: SHX124

## 2014-04-20 SURGERY — ARTHROPLASTY, HIP, TOTAL,POSTERIOR APPROACH
Anesthesia: General | Site: Hip | Laterality: Left

## 2014-04-20 MED ORDER — ACETAMINOPHEN 325 MG PO TABS
650.0000 mg | ORAL_TABLET | Freq: Four times a day (QID) | ORAL | Status: DC | PRN
  Administered 2014-04-20 – 2014-04-21 (×3): 650 mg via ORAL
  Filled 2014-04-20 (×3): qty 2

## 2014-04-20 MED ORDER — LISINOPRIL 10 MG PO TABS
10.0000 mg | ORAL_TABLET | Freq: Every day | ORAL | Status: DC
  Administered 2014-04-20 – 2014-04-24 (×5): 10 mg via ORAL
  Filled 2014-04-20 (×5): qty 1

## 2014-04-20 MED ORDER — OXYCODONE-ACETAMINOPHEN 5-325 MG PO TABS: 1.0000 | ORAL_TABLET | ORAL | Status: DC | PRN

## 2014-04-20 MED ORDER — KCL IN DEXTROSE-NACL 20-5-0.45 MEQ/L-%-% IV SOLN
INTRAVENOUS | Status: DC
  Administered 2014-04-20 – 2014-04-21 (×2): via INTRAVENOUS
  Filled 2014-04-20 (×15): qty 1000

## 2014-04-20 MED ORDER — SENNOSIDES-DOCUSATE SODIUM 8.6-50 MG PO TABS: 1.0000 | ORAL_TABLET | Freq: Every evening | ORAL | Status: DC | PRN

## 2014-04-20 MED ORDER — ASPIRIN EC 325 MG PO TBEC
325.0000 mg | DELAYED_RELEASE_TABLET | Freq: Every day | ORAL | Status: DC
  Administered 2014-04-21 – 2014-04-24 (×4): 325 mg via ORAL
  Filled 2014-04-20 (×5): qty 1

## 2014-04-20 MED ORDER — DEXAMETHASONE SODIUM PHOSPHATE 10 MG/ML IJ SOLN
INTRAMUSCULAR | Status: AC
  Filled 2014-04-20: qty 1

## 2014-04-20 MED ORDER — HYDROMORPHONE HCL PF 1 MG/ML IJ SOLN
INTRAMUSCULAR | Status: AC
  Filled 2014-04-20: qty 1

## 2014-04-20 MED ORDER — MENTHOL 3 MG MT LOZG
1.0000 | LOZENGE | OROMUCOSAL | Status: DC | PRN
  Filled 2014-04-20: qty 9

## 2014-04-20 MED ORDER — MIDAZOLAM HCL 2 MG/2ML IJ SOLN
INTRAMUSCULAR | Status: AC
  Filled 2014-04-20: qty 2

## 2014-04-20 MED ORDER — ONDANSETRON HCL 4 MG/2ML IJ SOLN: 4.0000 mg | Freq: Once | INTRAMUSCULAR | Status: DC | PRN

## 2014-04-20 MED ORDER — HYDROMORPHONE HCL PF 1 MG/ML IJ SOLN
0.2500 mg | INTRAMUSCULAR | Status: DC | PRN
  Administered 2014-04-20 (×4): 0.5 mg via INTRAVENOUS

## 2014-04-20 MED ORDER — ROCURONIUM BROMIDE 100 MG/10ML IV SOLN
INTRAVENOUS | Status: DC | PRN
  Administered 2014-04-20: 50 mg via INTRAVENOUS

## 2014-04-20 MED ORDER — ROCURONIUM BROMIDE 50 MG/5ML IV SOLN
INTRAVENOUS | Status: AC
  Filled 2014-04-20: qty 1

## 2014-04-20 MED ORDER — HYDROMORPHONE HCL PF 1 MG/ML IJ SOLN
0.5000 mg | INTRAMUSCULAR | Status: DC | PRN
  Administered 2014-04-20 – 2014-04-22 (×14): 1 mg via INTRAVENOUS
  Filled 2014-04-20 (×14): qty 1

## 2014-04-20 MED ORDER — BUPIVACAINE-EPINEPHRINE 0.5% -1:200000 IJ SOLN
INTRAMUSCULAR | Status: DC | PRN
  Administered 2014-04-20: 20 mL

## 2014-04-20 MED ORDER — PHENOL 1.4 % MT LIQD: 1.0000 | OROMUCOSAL | Status: DC | PRN

## 2014-04-20 MED ORDER — ALUMINUM HYDROXIDE GEL 320 MG/5ML PO SUSP
15.0000 mL | ORAL | Status: DC | PRN
  Filled 2014-04-20: qty 30

## 2014-04-20 MED ORDER — ARTIFICIAL TEARS OP OINT
TOPICAL_OINTMENT | OPHTHALMIC | Status: DC | PRN
  Administered 2014-04-20: 1 via OPHTHALMIC

## 2014-04-20 MED ORDER — ASPIRIN EC 325 MG PO TBEC: 325.0000 mg | DELAYED_RELEASE_TABLET | Freq: Two times a day (BID) | ORAL | Status: DC

## 2014-04-20 MED ORDER — LIDOCAINE HCL (CARDIAC) 20 MG/ML IV SOLN
INTRAVENOUS | Status: AC
  Filled 2014-04-20: qty 5

## 2014-04-20 MED ORDER — NEOSTIGMINE METHYLSULFATE 10 MG/10ML IV SOLN
INTRAVENOUS | Status: AC
  Filled 2014-04-20: qty 1

## 2014-04-20 MED ORDER — LIDOCAINE HCL (CARDIAC) 20 MG/ML IV SOLN
INTRAVENOUS | Status: DC | PRN
  Administered 2014-04-20 (×2): 50 mg via INTRAVENOUS

## 2014-04-20 MED ORDER — ONDANSETRON HCL 4 MG/2ML IJ SOLN
INTRAMUSCULAR | Status: DC | PRN
  Administered 2014-04-20: 4 mg via INTRAVENOUS

## 2014-04-20 MED ORDER — SODIUM CHLORIDE 0.9 % IR SOLN
Status: DC | PRN
  Administered 2014-04-20: 1000 mL

## 2014-04-20 MED ORDER — VITAMIN D3 25 MCG (1000 UNIT) PO TABS
1000.0000 [IU] | ORAL_TABLET | ORAL | Status: DC
  Administered 2014-04-21 – 2014-04-23 (×2): 1000 [IU] via ORAL
  Filled 2014-04-20 (×2): qty 1

## 2014-04-20 MED ORDER — CEFUROXIME SODIUM 1.5 G IJ SOLR
INTRAMUSCULAR | Status: AC
  Filled 2014-04-20: qty 1.5

## 2014-04-20 MED ORDER — OXYCODONE HCL 5 MG PO TABS: 5.0000 mg | ORAL_TABLET | Freq: Once | ORAL | Status: DC | PRN

## 2014-04-20 MED ORDER — METHOCARBAMOL 500 MG PO TABS
500.0000 mg | ORAL_TABLET | Freq: Four times a day (QID) | ORAL | Status: DC | PRN
  Administered 2014-04-20 – 2014-04-24 (×15): 500 mg via ORAL
  Filled 2014-04-20 (×15): qty 1

## 2014-04-20 MED ORDER — BISACODYL 5 MG PO TBEC: 5.0000 mg | DELAYED_RELEASE_TABLET | Freq: Every day | ORAL | Status: DC | PRN

## 2014-04-20 MED ORDER — FENTANYL CITRATE 0.05 MG/ML IJ SOLN
INTRAMUSCULAR | Status: AC
  Filled 2014-04-20: qty 5

## 2014-04-20 MED ORDER — BUPIVACAINE-EPINEPHRINE (PF) 0.5% -1:200000 IJ SOLN
INTRAMUSCULAR | Status: AC
  Filled 2014-04-20: qty 30

## 2014-04-20 MED ORDER — PROPOFOL 10 MG/ML IV BOLUS
INTRAVENOUS | Status: DC | PRN
  Administered 2014-04-20: 20 mg via INTRAVENOUS
  Administered 2014-04-20: 180 mg via INTRAVENOUS

## 2014-04-20 MED ORDER — VITAMIN B-12 1000 MCG PO TABS
1000.0000 ug | ORAL_TABLET | ORAL | Status: DC
  Administered 2014-04-21 – 2014-04-23 (×2): 1000 ug via ORAL
  Filled 2014-04-20 (×2): qty 1

## 2014-04-20 MED ORDER — DIPHENHYDRAMINE HCL 12.5 MG/5ML PO ELIX
12.5000 mg | ORAL_SOLUTION | ORAL | Status: DC | PRN
  Administered 2014-04-21 – 2014-04-24 (×4): 25 mg via ORAL
  Filled 2014-04-20 (×4): qty 10

## 2014-04-20 MED ORDER — ESCITALOPRAM OXALATE 10 MG PO TABS
10.0000 mg | ORAL_TABLET | Freq: Every day | ORAL | Status: DC
  Administered 2014-04-21 – 2014-04-24 (×4): 10 mg via ORAL
  Filled 2014-04-20 (×4): qty 1

## 2014-04-20 MED ORDER — ACETAMINOPHEN 650 MG RE SUPP: 650.0000 mg | Freq: Four times a day (QID) | RECTAL | Status: DC | PRN

## 2014-04-20 MED ORDER — METHOCARBAMOL 1000 MG/10ML IJ SOLN
500.0000 mg | Freq: Four times a day (QID) | INTRAVENOUS | Status: DC | PRN
  Filled 2014-04-20: qty 5

## 2014-04-20 MED ORDER — DEXTROSE-NACL 5-0.45 % IV SOLN: INTRAVENOUS | Status: DC

## 2014-04-20 MED ORDER — FLEET ENEMA 7-19 GM/118ML RE ENEM: 1.0000 | ENEMA | Freq: Once | RECTAL | Status: AC | PRN

## 2014-04-20 MED ORDER — METHOCARBAMOL 500 MG PO TABS
ORAL_TABLET | ORAL | Status: AC
  Filled 2014-04-20: qty 1

## 2014-04-20 MED ORDER — ONDANSETRON HCL 4 MG/2ML IJ SOLN: 4.0000 mg | Freq: Four times a day (QID) | INTRAMUSCULAR | Status: DC | PRN

## 2014-04-20 MED ORDER — ONDANSETRON HCL 4 MG PO TABS: 4.0000 mg | ORAL_TABLET | Freq: Four times a day (QID) | ORAL | Status: DC | PRN

## 2014-04-20 MED ORDER — DOCUSATE SODIUM 100 MG PO CAPS
100.0000 mg | ORAL_CAPSULE | Freq: Two times a day (BID) | ORAL | Status: DC
  Administered 2014-04-20 – 2014-04-24 (×9): 100 mg via ORAL
  Filled 2014-04-20 (×9): qty 1

## 2014-04-20 MED ORDER — LACTATED RINGERS IV SOLN
INTRAVENOUS | Status: DC | PRN
  Administered 2014-04-20 (×2): via INTRAVENOUS

## 2014-04-20 MED ORDER — OXYCODONE HCL 5 MG PO TABS
5.0000 mg | ORAL_TABLET | ORAL | Status: DC | PRN
  Administered 2014-04-20 – 2014-04-24 (×25): 10 mg via ORAL
  Filled 2014-04-20 (×25): qty 2

## 2014-04-20 MED ORDER — METOCLOPRAMIDE HCL 10 MG PO TABS: 5.0000 mg | ORAL_TABLET | Freq: Three times a day (TID) | ORAL | Status: DC | PRN

## 2014-04-20 MED ORDER — DEXAMETHASONE SODIUM PHOSPHATE 10 MG/ML IJ SOLN
INTRAMUSCULAR | Status: DC | PRN
  Administered 2014-04-20: 10 mg via INTRAVENOUS

## 2014-04-20 MED ORDER — FENTANYL CITRATE 0.05 MG/ML IJ SOLN
INTRAMUSCULAR | Status: DC | PRN
  Administered 2014-04-20: 50 ug via INTRAVENOUS
  Administered 2014-04-20: 100 ug via INTRAVENOUS
  Administered 2014-04-20: 50 ug via INTRAVENOUS
  Administered 2014-04-20: 100 ug via INTRAVENOUS
  Administered 2014-04-20: 50 ug via INTRAVENOUS

## 2014-04-20 MED ORDER — OXYCODONE HCL 5 MG/5ML PO SOLN: 5.0000 mg | Freq: Once | ORAL | Status: DC | PRN

## 2014-04-20 MED ORDER — PROPOFOL 10 MG/ML IV BOLUS
INTRAVENOUS | Status: AC
  Filled 2014-04-20: qty 20

## 2014-04-20 MED ORDER — ONDANSETRON HCL 4 MG/2ML IJ SOLN
INTRAMUSCULAR | Status: AC
  Filled 2014-04-20: qty 2

## 2014-04-20 MED ORDER — METOCLOPRAMIDE HCL 5 MG/ML IJ SOLN: 5.0000 mg | Freq: Three times a day (TID) | INTRAMUSCULAR | Status: DC | PRN

## 2014-04-20 MED ORDER — MIDAZOLAM HCL 5 MG/5ML IJ SOLN
INTRAMUSCULAR | Status: DC | PRN
  Administered 2014-04-20: 2 mg via INTRAVENOUS

## 2014-04-20 MED ORDER — ARTIFICIAL TEARS OP OINT
TOPICAL_OINTMENT | OPHTHALMIC | Status: AC
  Filled 2014-04-20: qty 3.5

## 2014-04-20 MED ORDER — METHOCARBAMOL 500 MG PO TABS: 500.0000 mg | ORAL_TABLET | Freq: Four times a day (QID) | ORAL | Status: DC | PRN

## 2014-04-20 MED ORDER — GLYCOPYRROLATE 0.2 MG/ML IJ SOLN
INTRAMUSCULAR | Status: AC
  Filled 2014-04-20: qty 3

## 2014-04-20 MED ORDER — CEFAZOLIN SODIUM 1-5 GM-% IV SOLN
INTRAVENOUS | Status: AC
  Filled 2014-04-20: qty 50

## 2014-04-20 SURGICAL SUPPLY — 60 items
BLADE SAW SGTL 18X1.27X75 (BLADE) ×2 IMPLANT
BLADE SAW SGTL 18X1.27X75MM (BLADE) ×1
BRUSH FEMORAL CANAL (MISCELLANEOUS) IMPLANT
CAPT HIP PF COP ×2 IMPLANT
COVER BACK TABLE 24X17X13 BIG (DRAPES) IMPLANT
COVER SURGICAL LIGHT HANDLE (MISCELLANEOUS) ×6 IMPLANT
DRAPE ORTHO SPLIT 77X108 STRL (DRAPES) ×3
DRAPE PROXIMA HALF (DRAPES) ×5 IMPLANT
DRAPE SURG ORHT 6 SPLT 77X108 (DRAPES) ×1 IMPLANT
DRAPE U-SHAPE 47X51 STRL (DRAPES) ×3 IMPLANT
DRILL BIT 7/64X5 (BIT) ×3 IMPLANT
DRSG AQUACEL AG ADV 3.5X10 (GAUZE/BANDAGES/DRESSINGS) ×3 IMPLANT
DRSG AQUACEL AG ADV 3.5X14 (GAUZE/BANDAGES/DRESSINGS) ×2 IMPLANT
DURAPREP 26ML APPLICATOR (WOUND CARE) ×3 IMPLANT
ELECT BLADE 4.0 EZ CLEAN MEGAD (MISCELLANEOUS) ×3
ELECT REM PT RETURN 9FT ADLT (ELECTROSURGICAL) ×3
ELECTRODE BLDE 4.0 EZ CLN MEGD (MISCELLANEOUS) IMPLANT
ELECTRODE REM PT RTRN 9FT ADLT (ELECTROSURGICAL) ×1 IMPLANT
GAUZE XEROFORM 1X8 LF (GAUZE/BANDAGES/DRESSINGS) ×3 IMPLANT
GLOVE BIO SURGEON STRL SZ 6.5 (GLOVE) ×1 IMPLANT
GLOVE BIO SURGEON STRL SZ7.5 (GLOVE) ×5 IMPLANT
GLOVE BIO SURGEON STRL SZ8.5 (GLOVE) ×6 IMPLANT
GLOVE BIO SURGEONS STRL SZ 6.5 (GLOVE) ×1
GLOVE BIOGEL PI IND STRL 8 (GLOVE) ×2 IMPLANT
GLOVE BIOGEL PI IND STRL 9 (GLOVE) ×1 IMPLANT
GLOVE BIOGEL PI INDICATOR 8 (GLOVE) ×2
GLOVE BIOGEL PI INDICATOR 9 (GLOVE) ×2
GLOVE BIOGEL PI ORTHO PRO SZ7 (GLOVE) ×2
GLOVE PI ORTHO PRO STRL SZ7 (GLOVE) IMPLANT
GLOVE SURG SS PI 7.0 STRL IVOR (GLOVE) ×6 IMPLANT
GLOVE SURG SS PI 7.5 STRL IVOR (GLOVE) ×2 IMPLANT
GOWN STRL REUS W/ TWL LRG LVL3 (GOWN DISPOSABLE) ×2 IMPLANT
GOWN STRL REUS W/ TWL XL LVL3 (GOWN DISPOSABLE) ×3 IMPLANT
GOWN STRL REUS W/TWL LRG LVL3 (GOWN DISPOSABLE) ×3
GOWN STRL REUS W/TWL XL LVL3 (GOWN DISPOSABLE) ×9
HANDPIECE INTERPULSE COAX TIP (DISPOSABLE)
HOOD PEEL AWAY FACE SHEILD DIS (HOOD) ×6 IMPLANT
KIT BASIN OR (CUSTOM PROCEDURE TRAY) ×3 IMPLANT
KIT ROOM TURNOVER OR (KITS) ×3 IMPLANT
MANIFOLD NEPTUNE II (INSTRUMENTS) ×3 IMPLANT
NEEDLE 22X1 1/2 (OR ONLY) (NEEDLE) ×3 IMPLANT
NS IRRIG 1000ML POUR BTL (IV SOLUTION) ×3 IMPLANT
PACK TOTAL JOINT (CUSTOM PROCEDURE TRAY) ×3 IMPLANT
PAD ARMBOARD 7.5X6 YLW CONV (MISCELLANEOUS) ×8 IMPLANT
PASSER SUT SWANSON 36MM LOOP (INSTRUMENTS) ×3 IMPLANT
PRESSURIZER FEMORAL UNIV (MISCELLANEOUS) IMPLANT
SET HNDPC FAN SPRY TIP SCT (DISPOSABLE) IMPLANT
SPONGE LAP 18X18 X RAY DECT (DISPOSABLE) ×2 IMPLANT
SUT ETHIBOND 2 V 37 (SUTURE) ×3 IMPLANT
SUT VIC AB 0 CTB1 27 (SUTURE) ×3 IMPLANT
SUT VIC AB 1 CTX 36 (SUTURE) ×3
SUT VIC AB 1 CTX36XBRD ANBCTR (SUTURE) ×1 IMPLANT
SUT VIC AB 2-0 CTB1 (SUTURE) ×3 IMPLANT
SUT VIC AB 3-0 SH 27 (SUTURE) ×3
SUT VIC AB 3-0 SH 27X BRD (SUTURE) ×1 IMPLANT
SYR CONTROL 10ML LL (SYRINGE) ×3 IMPLANT
TOWEL OR 17X24 6PK STRL BLUE (TOWEL DISPOSABLE) ×3 IMPLANT
TOWEL OR 17X26 10 PK STRL BLUE (TOWEL DISPOSABLE) ×3 IMPLANT
TOWER CARTRIDGE SMART MIX (DISPOSABLE) IMPLANT
TRAY FOLEY CATH 14FR (SET/KITS/TRAYS/PACK) ×2 IMPLANT

## 2014-04-20 NOTE — Interval H&P Note (Signed)
History and Physical Interval Note:  04/20/2014 7:06 AM  Memory Argue  has presented today for surgery, with the diagnosis of LEFT HIP OSTEOARTHRITIS  The various methods of treatment have been discussed with the patient and family. After consideration of risks, benefits and other options for treatment, the patient has consented to  Procedure(s): TOTAL HIP ARTHROPLASTY (Left) as a surgical intervention .  The patient's history has been reviewed, patient examined, no change in status, stable for surgery.  I have reviewed the patient's chart and labs.  Questions were answered to the patient's satisfaction.     Kerin Salen

## 2014-04-20 NOTE — Evaluation (Signed)
Physical Therapy Evaluation Patient Details Name: [name] MRN: [id number] DOB: 01-Feb-1961 Today's Date: 04/20/2014   History of Present Illness  Patient is a 53 yo female admitted 04/20/14 now s/p Lt THA with posterior precautions.  Patient with h/o osteoarthritis knees and hips, HTN, obesity, anxiety, depression, tachycardia.  Clinical Impression  Patient presents with problems listed below.  Will benefit from acute PT to maximize independence prior to discharge.  Patient lives with 56 yo daughter - only 24 hour assist available per patient.  Recommend SNF at discharge for continued therapy prior to returning home.    Follow Up Recommendations SNF;Supervision/Assistance - 24 hour    Equipment Recommendations  None recommended by PT    Recommendations for Other Services       Precautions / Restrictions Precautions Precautions: Posterior Hip;Fall Precaution Booklet Issued: Yes (comment) Precaution Comments: Reviewed precautions with patient and her sister. Restrictions Weight Bearing Restrictions: Yes LLE Weight Bearing: Weight bearing as tolerated      Mobility  Bed Mobility Overal bed mobility: Needs Assistance Bed Mobility: Supine to Sit     Supine to sit: Mod assist;HOB elevated     General bed mobility comments: Verbal cues for technique.  Patient required mod assist to move LLE off of bed and to raise trunk to sitting position.  Once upright, patient able to maintain sitting balance.  Sat EOB x 7 minutes.  Transfers Overall transfer level: Needs assistance Equipment used: Rolling walker (2 wheeled) Transfers: Sit to/from [facility] Sit to Stand: Mod assist Stand pivot transfers: Min assist       General transfer comment: Verbal cues for hand placement and technique to maintain hip precautions.  Assist to rise to standing.  Patient able to take several shuffle steps to pivot to chair.  Assist to control descent into  chair.  Ambulation/Gait                Stairs            Wheelchair Mobility    Modified Rankin (Stroke Patients Only)       Balance                                             Pertinent Vitals/Pain Pain 10/10 in Lt hip.  RN provided pain medicine.    Home Living Family/patient expects to be discharged to:: Private residence Living Arrangements: Children (Daughter - 72 yo) Available Help at Discharge: Family;Available PRN/intermittently (91 yo daughter is only 24 hour assist available per patient) Type of Home: House Home Access: Stairs to enter Entrance Stairs-Rails: None Entrance Stairs-Number of Steps: 3 Home Layout: Two level;Able to live on main level with bedroom/bathroom Home Equipment: Gilford Rile - 2 wheels;Cane - single point;Bedside commode      Prior Function Level of Independence: Independent with assistive device(s);Needs assistance   Gait / Transfers Assistance Needed: Uses cane and RW for ambulation short distances.  Will only go shopping in stores that have electric chairs (for longer distances)  ADL's / Homemaking Assistance Needed: Patient reports daughter and family assist with bathing, meals, and housekeeping.        Hand Dominance        Extremity/Trunk Assessment   Upper Extremity Assessment: Overall WFL for tasks assessed           Lower Extremity Assessment: LLE deficits/detail   LLE Deficits /  Details: Decreased strength/ROM post-op.       Communication   Communication: No difficulties  Cognition Arousal/Alertness: Awake/alert Behavior During Therapy: WFL for tasks assessed/performed Overall Cognitive Status: Within Functional Limits for tasks assessed                      General Comments      Exercises Total Joint Exercises Ankle Circles/Pumps: AROM;Both;10 reps;Seated      Assessment/Plan    PT Assessment Patient needs continued PT services  PT Diagnosis Difficulty  walking;Acute pain   PT Problem List Decreased strength;Decreased activity tolerance;Decreased range of motion;Decreased balance;Decreased mobility;Decreased knowledge of use of DME;Decreased knowledge of precautions;Obesity;Pain  PT Treatment Interventions DME instruction;Gait training;Functional mobility training;Stair training;Therapeutic activities;Therapeutic exercise;Patient/family education   PT Goals (Current goals can be found in the Care Plan section) Acute Rehab PT Goals Patient Stated Goal: To walk better PT Goal Formulation: With patient Time For Goal Achievement: 04/27/14 Potential to Achieve Goals: Good    Frequency 7X/week   Barriers to discharge Decreased caregiver support Per patient report, daughter (59 yo) is only 24 hour assist available.  Other family prn.    Co-evaluation               End of Session Equipment Utilized During Treatment: Gait belt;Oxygen Activity Tolerance: Patient limited by pain;Patient limited by fatigue Patient left: in chair;with call bell/phone within reach;with family/visitor present Nurse Communication: Mobility status;Patient requests pain meds         Time: 8657-8469 PT Time Calculation (min): 32 min   Charges:   PT Evaluation $Initial PT Evaluation Tier I: 1 Procedure PT Treatments $Therapeutic Activity: 23-37 mins   PT G Codes:          Despina Pole 04/20/2014, 5:09 PM Carita Pian. Sanjuana Kava, [place] Pager [phone]

## 2014-04-20 NOTE — Care Management Note (Signed)
CARE MANAGEMENT NOTE 04/20/2014  Patient:  [name],[name]   Account Number:  [id number]  Date Initiated:  04/20/2014  Documentation initiated by:  Ricki Miller  Subjective/Objective Assessment:   53 yr old female s/p left total hip arhtroplasty.     Action/Plan:   PT/OT to eval  CM will continue to monitor   Anticipated DC Date:  04/22/2014   Anticipated DC Plan:        [place]  CM consult      Choice offered to / List presented to:             Status of service:  In process, will continue to follow Medicare Important Message given?   (If response is "NO", the following Medicare IM given date fields will be blank) Date Medicare IM given:   Medicare IM given by:   Date Additional Medicare IM given:   Additional Medicare IM given by:    Discharge Disposition:    Per UR Regulation:  Reviewed for med. necessity/level of care/duration of stay

## 2014-04-20 NOTE — Transfer of Care (Signed)
Immediate Anesthesia Transfer of Care Note  Patient: [name]  Procedure(s) Performed: Procedure(s): TOTAL HIP ARTHROPLASTY (Left)  Patient Location: PACU  Anesthesia Type:General  Level of Consciousness: awake, alert , oriented and sedated  Airway & Oxygen Therapy: Patient Spontanous Breathing, Patient connected to nasal cannula oxygen and Patient connected to face mask oxygen  Post-op Assessment: Report given to PACU RN, Post -op Vital signs reviewed and stable and Patient moving all extremities  Post vital signs: Reviewed and stable  Complications: No apparent anesthesia complications

## 2014-04-20 NOTE — Care Management Utilization Note (Signed)
Utilization review completed by Virlee Stroschein N. Pearlie Lafosse, RN BSN 

## 2014-04-20 NOTE — Anesthesia Procedure Notes (Signed)
Procedure Name: Intubation Date/Time: 04/20/2014 7:39 AM Performed by: Scheryl Darter Pre-anesthesia Checklist: Patient identified, Timeout performed, Emergency Drugs available, Suction available and Patient being monitored Patient Re-evaluated:Patient Re-evaluated prior to inductionOxygen Delivery Method: Circle system utilized Preoxygenation: Pre-oxygenation with 100% oxygen Ventilation: Mask ventilation without difficulty Laryngoscope Size: Miller and 2 Grade View: Grade I Tube type: Oral Tube size: 7.5 mm Number of attempts: 1 Airway Equipment and Method: Stylet

## 2014-04-20 NOTE — Op Note (Signed)
OPERATIVE REPORT    DATE OF PROCEDURE:  04/20/2014       PREOPERATIVE DIAGNOSIS:  left hip osteoarthritis                                                          POSTOPERATIVE DIAGNOSIS:  left hip osteoarthritis                                                           PROCEDURE:  L total hip arthroplasty using a 52 mm DePuy Pinnacle  Cup, [employer], 10-degree polyethylene liner index superior  and posterior, a +0 36 mm ceramic head, a 16x11x36x150 SROM stem, 16BL Sleeve   SURGEON: Stone Spirito J    ASSISTANT:   [employer]  (present throughout entire procedure and necessary for timely completion of the procedure)   ANESTHESIA: General BLOOD LOSS: 500 FLUID REPLACEMENT: 180 crystalloid DRAINS: Foley Catheter URINE OUTPUT: 161WR COMPLICATIONS: none    INDICATIONS FOR PROCEDURE: A 53 y.o. year-old With  left hip osteoarthritis   for 5 years, x-rays show bone-on-bone arthritic changes. Despite conservative measures with observation, anti-inflammatory medicine, narcotics, use of a cane, has severe unremitting pain and can ambulate only a few blocks before resting.  Patient desires elective L total hip arthroplasty to decrease pain and increase function. The risks, benefits, and alternatives were discussed at length including but not limited to the risks of infection, bleeding, nerve injury, stiffness, blood clots, the need for revision surgery, cardiopulmonary complications, among others, and they were willing to proceed. Questions answered     PROCEDURE IN DETAIL: The patient was identified by armband,  received preoperative IV antibiotics in the holding area at [facility], taken to the operating room , appropriate anesthetic monitors  were attached and general endotracheal anesthesia induced. Foley catheter was inserted. Pt was rolled into the R lateral decubitus position and fixed there with a Stulberg Mark II pelvic clamp.  The L lower extremity was then  prepped and draped  in the usual sterile fashion from the ankle to the hemipelvis. A time-out  procedure was performed. The skin along the lateral hip and thigh  infiltrated with 10 mL of 0.5% Marcaine and epinephrine solution. We  then made a posterolateral approach to the hip. With a #10 blade, a 24 cm  incision was made through the skin and subcutaneous tissue down to the level of the  IT band. Small bleeders were identified and cauterized. The IT band was cut in  line with skin incision exposing the greater trochanter. A Cobra retractor was placed between the gluteus minimus and the superior hip joint capsule, and a spiked Cobra between the quadratus femoris and the inferior hip joint capsule. This isolated the short  external rotators and piriformis tendons. These were tagged with a #2 Ethibond  suture and cut off their insertion on the intertrochanteric crest. The posterior  capsule was then developed into an acetabular-based flap from Posterior Superior off of the acetabulum out over the femoral neck and back posterior inferior to the acetabular rim. This flap was tagged with two #2 Ethibond  sutures and retracted protecting the sciatic nerve. This exposed the arthritic femoral head and osteophytes. The hip was then flexed and internally rotated, dislocating the femoral head and a standard neck cut performed 1 fingerbreadth above the lesser trochanter.  A spiked Cobra was placed in the cotyloid notch and a Hohmann retractor was then used to lever the femur anteriorly off of the anterior pelvic column. A posterior-inferior wing retractor was placed at the junction of the acetabulum and the ischium completing the acetabular exposure.We then removed the peripheral osteophytes and labrum from the acetabulum. We then reamed the acetabulum up to 51 mm with basket reamers obtaining good coverage in all quadrants. We then irrigated with normal  saline solution and hammered into place a 52 mm pinnacle cup  in 45  degrees of abduction and about 20 degrees of anteversion. More  peripheral osteophytes removed and a trial 10-degree liner placed with the  index superior-posterior. The hip was then flexed and internally rotated exposing the  proximal femur, which was entered with the initiating reamer followed by  the axial reamers up to a 11.5 mm full depth and 44mm partial depth. We then conically reamed to 16B to the correct depth for a 42 base neck. The calcar was milled to 16BL. A trial cone and stem was inserted in the 25 degrees anteversion, with a +0 42mm trial head. Trial reduction was then performed and excellent stability was noted with at 90 of flexion with 70 of internal rotation and then full extension with maximal external rotation. The hip could not be dislocated in full extension. The knee could easily flex  to about 130 degrees. We also stretched the abductors at this point,  because of the preexisting adductor contractures. All trial components  were then removed. The acetabulum was irrigated out with normal saline  solution. A titanium Apex [facility] was then screwed into place  followed by a 10-degree polyethylene liner index superior-posterior. On  the femoral side a 16BL ZTT1 sleeve was hammered into place, followed by a 16x11x36x150 SROM stem in 25 degrees of anteversion. At this point, a +0 36 mm ceramic head was  hammered on the stem. The hip was reduced. We checked our stability  one more time and found it to be excellent. The wound was once again  thoroughly irrigated out with normal saline solution pulse lavage. The  capsular flap and short external rotators were repaired back to the  intertrochanteric crest through drill holes with a #2 Ethibond suture.  The IT band was closed with running 1 Vicryl suture. The subcutaneous  tissue with 0 and 2-0 undyed Vicryl suture and the skin with running  interlocking 3-0 nylon suture. Dressing of Xeroform and Mepilex was  then  applied. The patient was then unclamped, rolled supine, awaken extubated and taken to recovery room without difficulty in stable condition.   Zuleima Haser J 04/20/2014, 9:10 AM

## 2014-04-20 NOTE — Anesthesia Postprocedure Evaluation (Signed)
Anesthesia Post Note  Patient: [name]  Procedure(s) Performed: Procedure(s) (LRB): TOTAL HIP ARTHROPLASTY (Left)  Anesthesia type: General  Patient location: PACU  Post pain: Pain level controlled  Post assessment: Patient's Cardiovascular Status Stable  Last Vitals:  Filed Vitals:   04/20/14 1028  BP:   Pulse: 100  Temp:   Resp: 17    Post vital signs: Reviewed and stable  Level of consciousness: alert  Complications: No apparent anesthesia complications

## 2014-04-20 NOTE — Anesthesia Preprocedure Evaluation (Signed)
Anesthesia Evaluation  Patient identified by MRN, date of birth, ID band Patient awake    Reviewed: Allergy & Precautions, H&P , NPO status , Patient's Chart, lab work & pertinent test results  Airway Mallampati: II TM Distance: >3 FB Neck ROM: Full    Dental  (+) Teeth Intact, Dental Advisory Given   Pulmonary Current Smoker,  breath sounds clear to auscultation        Cardiovascular hypertension, Rhythm:Regular Rate:Normal     Neuro/Psych    GI/Hepatic   Endo/Other    Renal/GU      Musculoskeletal   Abdominal   Peds  Hematology   Anesthesia Other Findings   Reproductive/Obstetrics                           Anesthesia Physical Anesthesia Plan  ASA: III  Anesthesia Plan: General   Post-op Pain Management:    Induction: Intravenous  Airway Management Planned: Oral ETT  Additional Equipment:   Intra-op Plan:   Post-operative Plan: Extubation in OR  Informed Consent: I have reviewed the patients History and Physical, chart, labs and discussed the procedure including the risks, benefits and alternatives for the proposed anesthesia with the patient or authorized representative who has indicated his/her understanding and acceptance.   Dental advisory given  Plan Discussed with: CRNA and Anesthesiologist  Anesthesia Plan Comments: (Obesity Hypertension DJD L. Hip  Plan GA with oral ETT  Roberts Gaudy, MD)        Anesthesia Quick Evaluation

## 2014-04-21 ENCOUNTER — Encounter (HOSPITAL_COMMUNITY): Payer: Self-pay | Admitting: Orthopedic Surgery

## 2014-04-21 LAB — CBC
HCT: 32.3 % — ABNORMAL LOW (ref 36.0–46.0)
HEMOGLOBIN: 10.3 g/dL — AB (ref 12.0–15.0)
MCH: 28.8 pg (ref 26.0–34.0)
MCHC: 31.9 g/dL (ref 30.0–36.0)
MCV: 90.2 fL (ref 78.0–100.0)
Platelets: 195 10*3/uL (ref 150–400)
RBC: 3.58 MIL/uL — ABNORMAL LOW (ref 3.87–5.11)
RDW: 13.9 % (ref 11.5–15.5)
WBC: 12.3 10*3/uL — ABNORMAL HIGH (ref 4.0–10.5)

## 2014-04-21 LAB — BASIC METABOLIC PANEL
Anion gap: 10 (ref 5–15)
BUN: 12 mg/dL (ref 6–23)
CALCIUM: 8.1 mg/dL — AB (ref 8.4–10.5)
CO2: 24 mEq/L (ref 19–32)
Chloride: 103 mEq/L (ref 96–112)
Creatinine, Ser: 0.65 mg/dL (ref 0.50–1.10)
GLUCOSE: 141 mg/dL — AB (ref 70–99)
POTASSIUM: 4.2 meq/L (ref 3.7–5.3)
Sodium: 137 mEq/L (ref 137–147)

## 2014-04-21 MED ORDER — WHITE PETROLATUM GEL
Status: AC
  Administered 2014-04-21: 22:00:00
  Filled 2014-04-21: qty 5

## 2014-04-21 MED ORDER — HYDROCORTISONE ACETATE 25 MG RE SUPP
25.0000 mg | Freq: Two times a day (BID) | RECTAL | Status: DC
  Administered 2014-04-24: 25 mg via RECTAL
  Filled 2014-04-21 (×8): qty 1

## 2014-04-21 NOTE — Progress Notes (Signed)
Patient is experiencing 10 out of 10 pain, and very emotional. IV Dilaudid, Oxycodone and Robaxin are being given as often as possible PRN. Joanell Rising PA-C was called and made aware. No new changes to the orders made at this time. Will continue the PRN pain management, in addition to cold therapy, and include PO Tylenol. Will continue to monitor.

## 2014-04-21 NOTE — Care Management Note (Addendum)
CARE MANAGEMENT NOTE 04/21/2014  Patient:  [name],[name]   Account Number:  [id number]  Date Initiated:  04/20/2014  Documentation initiated by:  Ricki Miller  Subjective/Objective Assessment:   53 yr old female s/p left total hip arhtroplasty.     Action/Plan:   PT/OT to eval  Case manager spoke with patient concerning home health and DME needs. She has RW and 3in1. Choice offered for [facility].  CM received call patient is setup with [facility]. No changes.    Anticipated DC Date:  04/22/2014   Anticipated DC Plan:  [place]  CM consult      [facility] Choice  HOME HEALTH   Choice offered to / List presented to:  C-1 Patient   DME arranged  NA        [place] arranged  [place] [facility]      [place] agency  [facility].   Status of service:  Completed, signed off Medicare Important Message given?   (If response is "NO", the following Medicare IM given date fields will be blank) Date Medicare IM given:   Medicare IM given by:   Date Additional Medicare IM given:   Additional Medicare IM given by:    Discharge Disposition:  [place]  Per UR Regulation:  Reviewed for med. necessity/level of care/duration of stay    04/21/14 12:47pm Ricki Miller, RN BSN Case Manager  Patient will be going to family member's home : [address], [place], [place]

## 2014-04-21 NOTE — Evaluation (Signed)
Agree with note. [place], [address] [place] 04/21/2014 [name], OTR/L Pager: [phone]

## 2014-04-21 NOTE — Progress Notes (Signed)
Physical Therapy Treatment Patient Details Name: [name] MRN: [id number] DOB: [birth year] Today's Date: 04/21/2014    History of Present Illness Patient is a 53 yo female admitted 04/20/14 now s/p Lt THA with posterior precautions.  Patient with h/o osteoarthritis knees and hips, HTN, obesity, anxiety, depression, tachycardia.    PT Comments    Pt had much more motivation this session to get OOB and walk to bathroom. Pt did much better with therex with less muscle guarding. Educated pt on HEP. Pt declined SNF this time. Pt planning to return home with her daughter and a friend.   Follow Up Recommendations  Home health PT     Equipment Recommendations  None recommended by PT    Recommendations for Other Services       Precautions / Restrictions Precautions Precautions: Posterior Hip;Fall Precaution Comments: Pt able to state 2/3 precautions, they were reviewed with pt. Restrictions LLE Weight Bearing: Weight bearing as tolerated    Mobility  Bed Mobility Overal bed mobility: Needs Assistance Bed Mobility: Supine to Sit     Supine to sit: Mod assist;HOB elevated     General bed mobility comments: Verbal cues for technique.  Patient required mod assist to move LLE off of bed and to raise trunk to sitting position.  Pt stated she has a trapeze bar at home that she uses to bring her trunk to upright position.  Transfers Overall transfer level: Needs assistance Equipment used: Rolling walker (2 wheeled) Transfers: Sit to/from Stand Sit to Stand: Mod assist Stand pivot transfers: Min guard       General transfer comment: Verbal cues for hand placement and technique to maintain hip precautions by proping LLE out when sitting/standing.  Ambulation/Gait Ambulation/Gait assistance: Min guard Ambulation Distance (Feet): 15 Feet Assistive device: Rolling walker (2 wheeled) Gait Pattern/deviations: Step-to pattern;Decreased dorsiflexion - left;Decreased stride  length Gait velocity: decreased Gait velocity interpretation: Below normal speed for age/gender General Gait Details: Pt is having trouble with heel strike during amb. verbal cues needed for sequencing and posture.   Stairs            Wheelchair Mobility    Modified Rankin (Stroke Patients Only)       Balance Overall balance assessment: Needs assistance Sitting-balance support: Feet supported Sitting balance-Leahy Scale: Good       Standing balance-Leahy Scale: Poor Standing balance comment: relies heavily on RW for stability                    Cognition Arousal/Alertness: Awake/alert Behavior During Therapy: WFL for tasks assessed/performed Overall Cognitive Status: Within Functional Limits for tasks assessed       Memory: Decreased recall of precautions              Exercises Total Joint Exercises Quad Sets: AROM;Seated;Left;10 reps Gluteal Sets: AROM;Seated;Left;10 reps Heel Slides: AAROM;Seated;Left;5 reps Hip ABduction/ADduction: Seated;AAROM;Left;5 reps Long Arc Quad: AAROM;Seated;10 reps    General Comments        Pertinent Vitals/Pain no apparent distress. Pt repositioned in recliner for comfort.     Home Living Family/patient expects to be discharged to:: Private residence (Friends house) Living Arrangements: Children;Non-relatives/Friends (10 year daughter and friend intermittently) Available Help at Discharge: Family;Available PRN/intermittently Type of Home: House Home Access: Stairs to enter Entrance Stairs-Rails: None Home Layout: Two level;Able to live on main level with bedroom/bathroom Home Equipment: Gilford Rile - 2 wheels;Bedside commode      Prior Function Level of Independence: Independent with assistive device(s);Needs assistance  Gait / Transfers Assistance Needed: Uses cane and RW for ambulation short distances.  Will only go shopping in stores that have electric chairs (for longer distances). ADL's / Homemaking  Assistance Needed: Patient reports daughter and family assist with bathing, meals, and housekeeping. Using reacher to assist with undergarmets.     PT Goals (current goals can now be found in the care plan section) Acute Rehab PT Goals Patient Stated Goal: To walk better Progress towards PT goals: Progressing toward goals    Frequency  7X/week    PT Plan Discharge plan needs to be updated    Co-evaluation             End of Session Equipment Utilized During Treatment: Gait belt Activity Tolerance: Patient tolerated treatment well Patient left: in chair;with family/visitor present;with call bell/phone within reach     Time: 0209-0251 PT Time Calculation (min): 42 min  Charges:                       G Codes:      BRASFIELD,[name],SPTA 04/21/2014, 2:56 PM

## 2014-04-21 NOTE — Progress Notes (Signed)
Patient with increased pain and just had returned to bed. Will plan to ambulate next session. Per RN patient now planning to go home with friend. Initial PT eval recommended SNF. Will need to assess gait next session to determine safest DC plan.  04/21/2014 Jacqualyn Posey PTA [phone] pager [phone] office

## 2014-04-21 NOTE — Progress Notes (Addendum)
Patient ID: [name], female   DOB: [birth year], 53 y.o.   MRN: [id number] PATIENT ID: [name]  MRN: [id number]  DOB/AGE:  1961-08-06 / 53 y.o.  1 Day Post-Op Procedure(s) (LRB): TOTAL HIP ARTHROPLASTY (Left)    PROGRESS NOTE Subjective: Patient is alert, oriented, no Nausea, no Vomiting, yes passing gas, no Bowel Movement. Taking PO well. Reports enlarged hemorhoids Denies SOB, Chest or Calf Pain. Using Incentive Spirometer, PAS in place. Ambulate WBAT Patient reports pain as 4 on 0-10 scale  .    Objective: Vital signs in last 24 hours: Filed Vitals:   04/20/14 1300 04/20/14 2030 04/21/14 0035 04/21/14 0437  BP: 148/95 149/82 132/78 125/80  Pulse: 75 87 88 94  Temp: 98.3 F (36.8 C) 99 F (37.2 C) 98.5 F (36.9 C) 98.8 F (37.1 C)  TempSrc:  Oral Oral Oral  Resp: 20 18 16 16   Height:      Weight:      SpO2: 100% 91% 100% 100%      Intake/Output from previous day: I/O last 3 completed shifts: In: 2720 [P.O.:720; I.V.:2000] Out: 3050 [Urine:2750; Blood:300]   Intake/Output this shift:     LABORATORY DATA:  Recent Labs  04/21/14 0600  WBC 12.3*  HGB 10.3*  HCT 32.3*  PLT 195  NA 137  K 4.2  CL 103  CO2 24  BUN 12  CREATININE 0.65  GLUCOSE 141*  CALCIUM 8.1*    Examination: Neurologically intact ABD soft Neurovascular intact Sensation intact distally Intact pulses distally Dorsiflexion/Plantar flexion intact Incision: no drainage No cellulitis present Compartment soft} XR AP&Lat of hip shows well placed\fixed THA  Assessment:   1 Day Post-Op Procedure(s) (LRB): TOTAL HIP ARTHROPLASTY (Left) ADDITIONAL DIAGNOSIS:  Hypertension  Plan: PT/OT WBAT, THA  posterior precautions  Phenylephrine suppository bid for hemorhoids  DVT Prophylaxis: SCDx72 hrs, ASA 325 mg BID x 2 weeks  DISCHARGE PLAN: Home , 1-2 days  DISCHARGE NEEDS: HHPT, HHRN, CPM, Walker and 3-in-1 comode seat

## 2014-04-21 NOTE — Progress Notes (Signed)
Physical Therapy Treatment Patient Details Name: [name] MRN: [id number] DOB: Dec 04, 1960 Today's Date: 04/21/2014    History of Present Illness Patient is a 53 yo female admitted 04/20/14 now s/p Lt THA with posterior precautions.  Patient with h/o osteoarthritis knees and hips, HTN, obesity, anxiety, depression, tachycardia.    PT Comments    Pt unable to get out of bed. Performed bedside there ex with pt in supine. Will plan on amb next session.   Follow Up Recommendations  Supervision/Assistance - 24 hour;SNF     Equipment Recommendations  None recommended by PT    Recommendations for Other Services       Precautions / Restrictions Precautions Precautions: Posterior Hip;Fall Restrictions Weight Bearing Restrictions: Yes LLE Weight Bearing: Weight bearing as tolerated    Mobility  Bed Mobility                  Transfers                    Ambulation/Gait                 Stairs            Wheelchair Mobility    Modified Rankin (Stroke Patients Only)       Balance                                    Cognition Arousal/Alertness: Awake/alert Behavior During Therapy: WFL for tasks assessed/performed Overall Cognitive Status: Within Functional Limits for tasks assessed                      Exercises Total Joint Exercises Ankle Circles/Pumps: AROM;Seated;20 reps;Both Quad Sets: AROM;Seated;Left;10 reps Gluteal Sets: AROM;Seated;Left;10 reps Heel Slides: AAROM;Seated;Left;10 reps Hip ABduction/ADduction: AAROM;Seated;Left;5 reps    General Comments        Pertinent Vitals/Pain Pr did not rate pain, RN administered pain meds during session.     Home Living                      Prior Function            PT Goals (current goals can now be found in the care plan section) Progress towards PT goals: Progressing toward goals    Frequency  7X/week    PT Plan       Co-evaluation             End of Session   Activity Tolerance: Patient limited by pain Patient left: in bed;with call bell/phone within reach     Time: 3664-4034 PT Time Calculation (min): 36 min  Charges:                       G CodesChrissie Noa, SPTA 04/21/2014, 10:10 AM

## 2014-04-21 NOTE — Evaluation (Signed)
Occupational Therapy Evaluation Patient Details Name: [name] MRN: [id number] DOB: 1961-06-03 Today's Date: 04/21/2014    History of Present Illness Patient is a 53 yo female admitted 04/20/14 now s/p Lt THA with posterior precautions.  Patient with h/o osteoarthritis knees and hips, HTN, obesity, anxiety, depression, tachycardia.   Clinical Impression   Pta pt required assistance for IADL/ADLs due to pain and now presents with generalized weakness, acute pain and posterior hip precautions interfering with her independence with self care tasks. Educated pt on the importance of getting OOB, LB ADL compensatory strategies and AE to assist with those tasks, and reviewed her precautions with her. Pt would benefit from additional acute OT prior to D/C to maximize independence at home with intermittent supervision. Will continue to follow.    Follow Up Recommendations  Supervision/Assistance - 24 hour    Equipment Recommendations  Tub/shower bench (long handled sponge)       Precautions / Restrictions Precautions Precautions: Posterior Hip;Fall Precaution Comments: Pt able to state 2/3 precautions, they were reviewed with pt. Restrictions LLE Weight Bearing: Weight bearing as tolerated      Mobility Bed Mobility Overal bed mobility: Needs Assistance Bed Mobility: Supine to Sit     Supine to sit: Mod assist;HOB elevated     General bed mobility comments: Verbal cues for technique.  Patient required mod assist to move LLE off of bed and to raise trunk to sitting position.  Pt stated she has a trapeze bar at home that she uses to bring her trunk to upright position.  Transfers Overall transfer level: Needs assistance Equipment used: Rolling walker (2 wheeled) Transfers: Sit to/from Stand Sit to Stand: Min assist       General transfer comment: Verbal cues for hand placement and technique to maintain hip precautions by proping LLE out when sitting/standing.    Balance  Overall balance assessment: Needs assistance Sitting-balance support: Feet supported Sitting balance-Leahy Scale: Good       Standing balance-Leahy Scale: Poor Standing balance comment: relies heavily on RW for stability                            ADL Overall ADL's : Needs assistance/impaired Eating/Feeding: Independent;Sitting   Grooming: Set up;Sitting   Upper Body Bathing: Set up;Sitting   Lower Body Bathing: Moderate assistance;Sit to/from stand;With adaptive equipment;Adhering to hip precautions   Upper Body Dressing : Set up;Sitting   Lower Body Dressing: Moderate assistance;With adaptive equipment;Sit to/from stand;Adhering to hip precautions   Toilet Transfer: Min guard;Ambulation;RW;BSC   Toileting- Water quality scientist and Hygiene: Sit to/from stand;Minimal assistance       Functional mobility during ADLs: Min guard;Rolling walker General ADL Comments: Pt reluctant to get OOB for ADLs but encouraged to get to the restroom. Pt had previous hip done last year and is familiar with AE to assist with LB dressing, stating she has been using a reacher to assist with undergarments.                Pertinent Vitals/Pain Pt c/o pain with mobility during tx. Pt was premedicated prior to start of therapy and was repositioned in chair.     Hand Dominance Right   Extremity/Trunk Assessment Upper Extremity Assessment Upper Extremity Assessment: Overall WFL for tasks assessed   Lower Extremity Assessment Lower Extremity Assessment: Defer to PT evaluation       Communication Communication Communication: No difficulties   Cognition Arousal/Alertness: Awake/alert Behavior During Therapy:  WFL for tasks assessed/performed Overall Cognitive Status: Within Functional Limits for tasks assessed       Memory: Decreased recall of precautions                        Home Living Family/patient expects to be discharged to:: Private residence (Friends  house) Living Arrangements: Children;Non-relatives/Friends (10 year daughter and friend intermittently) Available Help at Discharge: Family;Available PRN/intermittently Type of Home: House Home Access: Stairs to enter [employer] of Steps: 4 Entrance Stairs-Rails: None Home Layout: Two level;Able to live on main level with bedroom/bathroom     Bathroom Shower/Tub: Teacher, early years/pre: Standard     Home Equipment: Environmental consultant - 2 wheels;Bedside commode Adaptive Equipment: Reacher        Prior Functioning/Environment Level of Independence: Independent with assistive device(s);Needs assistance  Gait / Transfers Assistance Needed: Uses cane and RW for ambulation short distances.  Will only go shopping in stores that have electric chairs (for longer distances). ADL's / Homemaking Assistance Needed: Patient reports daughter and family assist with bathing, meals, and housekeeping. Using reacher to assist with undergarments.        OT Diagnosis: Generalized weakness;Acute pain   OT Problem List: Decreased strength;Decreased range of motion;Decreased activity tolerance;Decreased knowledge of use of DME or AE;Obesity;Impaired balance (sitting and/or standing);Decreased knowledge of precautions;Pain   OT Treatment/Interventions: Self-care/ADL training;Energy conservation;DME and/or AE instruction;Balance training;Patient/family education    OT Goals(Current goals can be found in the care plan section) Acute Rehab OT Goals Patient Stated Goal: To walk better OT Goal Formulation: With patient Time For Goal Achievement: 04/28/14 Potential to Achieve Goals: Good  OT Frequency: Min 3X/week   Barriers to D/C: Decreased caregiver support (pt only has 71 year old daughter at home during the day)             End of Session Equipment Utilized During Treatment: Gait belt;Rolling walker  Activity Tolerance: Patient limited by pain Patient left: in chair;with call  bell/phone within reach;with family/visitor present   Time:  -    Charges:    G-CodesDewaine Conger, Loree Fee 04/23/14, 3:00 PM

## 2014-04-22 LAB — CBC
HCT: 29.3 % — ABNORMAL LOW (ref 36.0–46.0)
Hemoglobin: 9.4 g/dL — ABNORMAL LOW (ref 12.0–15.0)
MCH: 29 pg (ref 26.0–34.0)
MCHC: 32.1 g/dL (ref 30.0–36.0)
MCV: 90.4 fL (ref 78.0–100.0)
PLATELETS: 179 10*3/uL (ref 150–400)
RBC: 3.24 MIL/uL — AB (ref 3.87–5.11)
RDW: 14.2 % (ref 11.5–15.5)
WBC: 11.9 10*3/uL — ABNORMAL HIGH (ref 4.0–10.5)

## 2014-04-22 MED ORDER — OXYCODONE HCL ER 10 MG PO T12A
10.0000 mg | EXTENDED_RELEASE_TABLET | Freq: Two times a day (BID) | ORAL | Status: DC
  Administered 2014-04-22 – 2014-04-24 (×5): 10 mg via ORAL
  Filled 2014-04-22 (×5): qty 1

## 2014-04-22 NOTE — Progress Notes (Signed)
Occupational Therapy Treatment Patient Details Name: SHALANA JARDIN MRN: [id number] DOB: 1961/09/22 Today's Date: 04/22/2014    History of present illness Patient is a 53 yo female admitted 04/20/14 now s/p Lt THA with posterior precautions.  Patient with h/o osteoarthritis knees and hips, HTN, obesity, anxiety, depression, tachycardia.   OT comments  Focus of session was on reviewing and practice AE for LB dressing and educating pt on the best tub transfer technique to a shower seat with sliding glass doors. Pt is progressing towards her goals and would benefit from additional acute OT to practice a tub transfer and review LB ADL compensatory strategies. Will continue to follow.  Follow Up Recommendations  Supervision/Assistance - 24 hour    Equipment Recommendations   (long handled sponge)       Precautions / Restrictions Precautions Precautions: Posterior Hip;Fall Precaution Comments: Pt able to state 3/3 precautions. Restrictions LLE Weight Bearing: Weight bearing as tolerated       Mobility Bed Mobility     General bed mobility comments: Pt up in chair upon OT tx.  Transfers       General transfer comment: Pt declined standing stating she had just gotten to the chair.        ADL Overall ADL's : Needs assistance/impaired                     Lower Body Dressing: With adaptive equipment;Sit to/from stand;Adhering to hip precautions;Minimal assistance (using reacher)                 General ADL Comments: Pt had just gotten to the chair after taking a sponge bath. Reviewed AE with pt and had her demonstrate understanding of how to use the sock aide and reacher to assist with LB dressing. Educated pt on tub transfer technique to the shower seat with a sliding glass door.                Cognition   Behavior During Therapy: WFL for tasks assessed/performed Overall Cognitive Status: Within Functional Limits for tasks assessed                              Pertinent Vitals/ Pain      Pt c/o pain during tx but did not rate, she was repositioned in recliner.         Frequency Min 3X/week     Progress Toward Goals  OT Goals(current goals can now be found in the care plan section)  Progress towards OT goals: Progressing toward goals  Acute Rehab OT Goals Patient Stated Goal: To walk better OT Goal Formulation: With patient Time For Goal Achievement: 04/28/14 Potential to Achieve Goals: Good  Plan Discharge plan remains appropriate          Activity Tolerance Patient tolerated treatment well   Patient Left in chair;with call bell/phone within reach;with family/visitor present           Time:  -     Charges:    Lyda Perone 04/22/2014, 11:56 AM

## 2014-04-22 NOTE — Progress Notes (Signed)
PATIENT ID: [name]  MRN: [id number]  DOB/AGE:  July 06, 1961 / 53 y.o.  2 Days Post-Op Procedure(s) (LRB): TOTAL HIP ARTHROPLASTY (Left)    PROGRESS NOTE Subjective: Patient is alert, oriented, no Nausea, no Vomiting, yes passing gas, no Bowel Movement. Taking PO ok with small bites. Denies SOB, Chest or Calf Pain. Using Incentive Spirometer, PAS in place. Ambulate WBAT Patient reports pain as 10 on 0-10 scale  .    Objective: Vital signs in last 24 hours: Filed Vitals:   04/21/14 1300 04/21/14 1600 04/21/14 2200 04/22/14 0615  BP: 130/68  118/68 138/86  Pulse: 99  77 103  Temp: 100.3 F (37.9 C)  99.1 F (37.3 C) 99.7 F (37.6 C)  TempSrc:   Oral Oral  Resp: 18 18 18 18   Height:      Weight:      SpO2: 97%  98% 98%      Intake/Output from previous day: I/O last 3 completed shifts: In: 2715 [P.O.:1200; I.V.:1515] Out: 1975 [TJQZE:0923]   Intake/Output this shift:     LABORATORY DATA:  Recent Labs  04/21/14 0600 04/22/14 0610  WBC 12.3* 11.9*  HGB 10.3* 9.4*  HCT 32.3* 29.3*  PLT 195 179  NA 137  --   K 4.2  --   CL 103  --   CO2 24  --   BUN 12  --   CREATININE 0.65  --   GLUCOSE 141*  --   CALCIUM 8.1*  --     Examination: Neurologically intact Neurovascular intact Sensation intact distally Intact pulses distally Dorsiflexion/Plantar flexion intact Incision: dressing C/D/I and no drainage No cellulitis present Compartment soft} XR AP&Lat of hip shows well placed\fixed THA  Assessment:   2 Days Post-Op Procedure(s) (LRB): TOTAL HIP ARTHROPLASTY (Left) ADDITIONAL DIAGNOSIS:  Hypertension and obesity  Plan: PT/OT WBAT, THA  posterior precautions  DVT Prophylaxis: SCDx72 hrs, ASA 325 mg BID x 2 weeks  DISCHARGE PLAN: Home most likely tomorrow.  DISCHARGE NEEDS: HHPT, HHRN, Walker and 3-in-1 comode seat

## 2014-04-22 NOTE — Progress Notes (Signed)
Seen and agreed 04/22/2014 Jacqualyn Posey PTA [phone] pager [phone] office

## 2014-04-22 NOTE — Progress Notes (Signed)
Physical Therapy Treatment Patient Details Name: [name] MRN: [id number] DOB: [birth year] Today's Date: 04/22/2014    History of Present Illness Patient is a 53 yo female admitted 04/20/14 now s/p Lt THA with posterior precautions.  Patient with h/o osteoarthritis knees and hips, HTN, obesity, anxiety, depression, tachycardia.    PT Comments    Pt is very self limiting. Pt is concerned about going home being independent. Spoke with pt in regards to SNF, however unsure if pt is in agreement. Will continue to progress pt at tolerated. Will attempt stairs in AM with pt planning to D/C home, pt is worried she wont be able to complete them.   Follow Up Recommendations  Home health PT     Equipment Recommendations  None recommended by PT    Recommendations for Other Services       Precautions / Restrictions Precautions Precautions: Posterior Hip;Fall Precaution Comments: Pt able to state 3/3 precautions. Restrictions LLE Weight Bearing: Weight bearing as tolerated    Mobility  Bed Mobility               General bed mobility comments: Pt in recliner before and after session   Transfers Overall transfer level: Needs assistance Equipment used: Rolling walker (2 wheeled) Transfers: Sit to/from Stand Sit to Stand: Min assist         General transfer comment: Pt needs min A to power up from sitting and to steady.   Ambulation/Gait Ambulation/Gait assistance: Min guard Ambulation Distance (Feet): 30 Feet Assistive device: Rolling walker (2 wheeled) Gait Pattern/deviations: Step-to pattern;Decreased dorsiflexion - left;Antalgic Gait velocity: very decreased   General Gait Details: Pt is having trouble with heel strike during amb. verbal cues needed for sequencing and posture. Pt is not motivated to amb at all. very limited by pain.    Stairs            Wheelchair Mobility    Modified Rankin (Stroke Patients Only)       Balance                                     Cognition Arousal/Alertness: Awake/alert Behavior During Therapy: WFL for tasks assessed/performed Overall Cognitive Status: Within Functional Limits for tasks assessed                      Exercises Total Joint Exercises Ankle Circles/Pumps: AROM;Seated;15 reps;Both Quad Sets: AROM;Seated;Left;10 reps Gluteal Sets: AROM;Seated;Left;10 reps Heel Slides: AAROM;Seated;Left;5 reps Hip ABduction/ADduction: Seated;AAROM;Left;5 reps Long Arc Quad: AAROM;Seated;10 reps    General Comments        Pertinent Vitals/Pain no apparent distress. Pt repositioned for comfort in recliner.     Home Living                      Prior Function            PT Goals (current goals can now be found in the care plan section) Acute Rehab PT Goals Patient Stated Goal: To walk better Progress towards PT goals: Progressing toward goals    Frequency  7X/week    PT Plan      Co-evaluation             End of Session Equipment Utilized During Treatment: Gait belt Activity Tolerance: Patient limited by pain;Patient limited by fatigue Patient left: in chair;with family/visitor present;with call bell/phone within reach  Time: 4461-9012 PT Time Calculation (min): 29 min  Charges:                       G Codes:      BRASFIELD,[name],SPTA 04/22/2014, 2:11 PM

## 2014-04-22 NOTE — Progress Notes (Signed)
Physical Therapy Treatment Patient Details Name: [name] MRN: [id number] DOB: [birth year] Today's Date: 04/22/2014    History of Present Illness Patient is a 53 yo female admitted 04/20/14 now s/p Lt THA with posterior precautions.  Patient with h/o osteoarthritis knees and hips, HTN, obesity, anxiety, depression, tachycardia.    PT Comments    Pt did much better with mobility today, but still limited by pain and fatigue. Pt is not very motivated to amb or exercise. Requires pain meds before sessions. Pt planning to D/C home tomorrow.   Follow Up Recommendations  Home health PT     Equipment Recommendations  None recommended by PT    Recommendations for Other Services       Precautions / Restrictions Precautions Precautions: Posterior Hip;Fall Restrictions LLE Weight Bearing: Weight bearing as tolerated    Mobility  Bed Mobility Overal bed mobility: Needs Assistance Bed Mobility: Supine to Sit     Supine to sit: HOB elevated;Min assist     General bed mobility comments: min assist to pull upper body up from supine. Pt says she has trapeze bar at home. Pt is doing better with scooting to EOB on own to come to sitting  Transfers Overall transfer level: Needs assistance Equipment used: Rolling walker (2 wheeled) Transfers: Sit to/from Stand Sit to Stand: Min assist         General transfer comment: min assist from elevated bed and to help power up from sitting.   Ambulation/Gait Ambulation/Gait assistance: Min guard Ambulation Distance (Feet): 25 Feet Assistive device: Rolling walker (2 wheeled) Gait Pattern/deviations: Step-to pattern;Decreased dorsiflexion - left;Decreased stride length Gait velocity: decreased Gait velocity interpretation: Below normal speed for age/gender General Gait Details: Pt is having trouble with heel strike during amb. verbal cues needed for sequencing and posture.    Stairs            Wheelchair Mobility     Modified Rankin (Stroke Patients Only)       Balance                                    Cognition Arousal/Alertness: Awake/alert Behavior During Therapy: WFL for tasks assessed/performed Overall Cognitive Status: Within Functional Limits for tasks assessed                      Exercises Total Joint Exercises Ankle Circles/Pumps: AROM;Seated;15 reps;Both Quad Sets: AROM;Seated;Left;10 reps Gluteal Sets: AROM;Seated;Left;10 reps Heel Slides: AAROM;Seated;Left;5 reps Hip ABduction/ADduction: Seated;AAROM;Left;5 reps    General Comments        Pertinent Vitals/Pain Pt premedicated. Pt repositioned in chair for comfort.    Home Living                      Prior Function            PT Goals (current goals can now be found in the care plan section) Progress towards PT goals: Progressing toward goals    Frequency  7X/week    PT Plan      Co-evaluation             End of Session Equipment Utilized During Treatment: Gait belt Activity Tolerance: Patient tolerated treatment well;Patient limited by pain;Patient limited by fatigue Patient left: in chair;with family/visitor present;with call bell/phone within reach     Time: 0912-0942 PT Time Calculation (min): 30 min  Charges:  G Codes:      BRASFIELD,Hermione Havlicek,SPTA 04/22/2014, 9:48 AM

## 2014-04-22 NOTE — Progress Notes (Signed)
Agree with note. [id number] 2SC 04/22/2014 [name], OTR/L Pager: [phone]

## 2014-04-22 NOTE — Progress Notes (Signed)
Seen and agreed 04/22/2014 Jacqualyn Posey PTA [phone] pager [phone] office

## 2014-04-22 NOTE — Progress Notes (Signed)
Agree with SPTA.    Anderson Coppock, PT [phone]  

## 2014-04-23 LAB — CBC
HCT: 29.1 % — ABNORMAL LOW (ref 36.0–46.0)
Hemoglobin: 9.3 g/dL — ABNORMAL LOW (ref 12.0–15.0)
MCH: 29.5 pg (ref 26.0–34.0)
MCHC: 32 g/dL (ref 30.0–36.0)
MCV: 92.4 fL (ref 78.0–100.0)
Platelets: 166 10*3/uL (ref 150–400)
RBC: 3.15 MIL/uL — ABNORMAL LOW (ref 3.87–5.11)
RDW: 14.1 % (ref 11.5–15.5)
WBC: 8.5 10*3/uL (ref 4.0–10.5)

## 2014-04-23 MED ORDER — WHITE PETROLATUM GEL
Status: AC
  Administered 2014-04-23: 1
  Filled 2014-04-23: qty 5

## 2014-04-23 NOTE — Progress Notes (Signed)
Physical Therapy Treatment Patient Details Name: DELANDA BULLUCK MRN: [id number] DOB: 1961/08/19 Today's Date: 04/23/2014    History of Present Illness Patient is a 53 yo female admitted 04/20/14 now s/p Lt THA with posterior precautions.  Patient with h/o osteoarthritis knees and hips, HTN, obesity, anxiety, depression, tachycardia.    PT Comments    Pt more motivated this session but still self limiting. Pt is becoming more independent with mobility and transfers, but still lacks strength in LLE. Plan to practice stairs next session.  Follow Up Recommendations  Home health PT     Equipment Recommendations  None recommended by PT    Recommendations for Other Services       Precautions / Restrictions Precautions Precautions: Posterior Hip;Fall Restrictions LLE Weight Bearing: Weight bearing as tolerated    Mobility  Bed Mobility Overal bed mobility: Needs Assistance Bed Mobility: Supine to Sit     Supine to sit: HOB elevated;Min assist     General bed mobility comments: pt needs assist bringing UE up to sitting from supine.   Transfers Overall transfer level: Needs assistance Equipment used: Rolling walker (2 wheeled) Transfers: Sit to/from Stand Sit to Stand: Min guard         General transfer comment: min guard for safety. Pt doing well with coming to standing independently.   Ambulation/Gait Ambulation/Gait assistance: Supervision Ambulation Distance (Feet): 100 Feet Assistive device: Rolling walker (2 wheeled) Gait Pattern/deviations: Step-to pattern;Decreased stride length Gait velocity: decreased Gait velocity interpretation: Below normal speed for age/gender General Gait Details: good control of RW and good with sequencing. Pt more motivated to amb today   Financial trader Rankin (Stroke Patients Only)       Balance                                    Cognition Arousal/Alertness:  Awake/alert Behavior During Therapy: WFL for tasks assessed/performed Overall Cognitive Status: Within Functional Limits for tasks assessed                      Exercises Total Joint Exercises Ankle Circles/Pumps: AROM;Seated;15 reps;Both Quad Sets: AROM;Seated;Left;10 reps Gluteal Sets: AROM;Seated;Left;10 reps Heel Slides: AAROM;Seated;Left;5 reps Hip ABduction/ADduction: Seated;AAROM;Left;5 reps Long Arc Quad: AAROM;Seated;10 reps    General Comments        Pertinent Vitals/Pain 7/10. Pt repositioned in recliner for comfort.    Home Living                      Prior Function            PT Goals (current goals can now be found in the care plan section) Progress towards PT goals: Progressing toward goals    Frequency  7X/week    PT Plan      Co-evaluation             End of Session Equipment Utilized During Treatment: Gait belt Activity Tolerance: Patient tolerated treatment well;Patient limited by pain Patient left: in chair;with family/visitor present;with call bell/phone within reach     Time: 1040-1115 PT Time Calculation (min): 35 min  Charges:                       G Codes:      BRASFIELD,Dyasia Firestine,SPTA 04/23/2014, 12:25 PM

## 2014-04-23 NOTE — Progress Notes (Signed)
Agree with note 04/23/14 Time: 3202-3343 Bar[place], OTR/L  [phone] 04/23/2014

## 2014-04-23 NOTE — Progress Notes (Signed)
Physical Therapy Treatment Patient Details Name: [name] MRN: [id number] DOB: [birth year] Today's Date: 04/23/2014    History of Present Illness Patient is a 53 yo female admitted 04/20/14 now s/p Lt THA with posterior precautions.  Patient with h/o osteoarthritis knees and hips, HTN, obesity, anxiety, depression, tachycardia.    PT Comments    Pt was able to complete stair training without any problems or concerns. Pt was able to amb after stair training until she became limited by fatigue. Pt planning to D/C home tomorrow after one stair training session.   Follow Up Recommendations  Home health PT     Equipment Recommendations  None recommended by PT    Recommendations for Other Services       Precautions / Restrictions Precautions Precautions: Posterior Hip;Fall Precaution Comments: Pt able to state 3/3 precautions. Restrictions LLE Weight Bearing: Weight bearing as tolerated    Mobility  Bed Mobility Overal bed mobility: Needs Assistance Bed Mobility: Supine to Sit     Supine to sit: HOB elevated;Min assist     General bed mobility comments: pt in recliner before and after session.   Transfers Overall transfer level: Needs assistance Equipment used: Rolling walker (2 wheeled) Transfers: Sit to/from Stand Sit to Stand: Min guard         General transfer comment: min guard for safety.   Ambulation/Gait Ambulation/Gait assistance: Supervision Ambulation Distance (Feet): 100 Feet Assistive device: Rolling walker (2 wheeled) Gait Pattern/deviations: Step-to pattern;Decreased stride length Gait velocity: decreased Gait velocity interpretation: Below normal speed for age/gender General Gait Details: good control of RW and good with sequencing. Pt more motivated to amb today   Stairs Stairs: Yes Stairs assistance: Min guard Stair Management: No rails;Backwards;Step to pattern Number of Stairs: 3 General stair comments: Pt did well with stair  training. Educated pt and her daughter on proper technique and for daughter to help with RW.   Wheelchair Mobility    Modified Rankin (Stroke Patients Only)       Balance Overall balance assessment: Needs assistance Sitting-balance support: Feet supported Sitting balance-Leahy Scale: Good     Standing balance support: During functional activity Standing balance-Leahy Scale: Fair                      Cognition Arousal/Alertness: Awake/alert Behavior During Therapy: WFL for tasks assessed/performed Overall Cognitive Status: Within Functional Limits for tasks assessed                      Exercises Total Joint Exercises Ankle Circles/Pumps: AROM;Seated;15 reps;Both Quad Sets: AROM;Seated;Left;10 reps Gluteal Sets: AROM;Seated;Left;10 reps Heel Slides: AAROM;Seated;Left;5 reps Hip ABduction/ADduction: Seated;AAROM;Left;5 reps Long Arc Quad: AAROM;Seated;10 reps    General Comments        Pertinent Vitals/Pain no apparent distress. Pt repositioned in recliner for comfort.     Home Living                      Prior Function            PT Goals (current goals can now be found in the care plan section) Acute Rehab PT Goals Patient Stated Goal: To walk better Progress towards PT goals: Progressing toward goals    Frequency  7X/week    PT Plan      Co-evaluation             End of Session Equipment Utilized During Treatment: Gait belt Activity Tolerance: Patient tolerated treatment well  Patient left: in chair;with family/visitor present;with call bell/phone within reach     Time: 1320-1354 PT Time Calculation (min): 34 min  Charges:  $Gait Training: 23-37 mins $Therapeutic Exercise: 8-22 mins                    G Codes:      BRASFIELD,Tierany Appleby,SPTA 04/23/2014, 2:41 PM

## 2014-04-23 NOTE — Progress Notes (Signed)
Patient ID: [name], female   DOB: 12/19/1960, 53 y.o.   MRN: [id number] PATIENT ID: [name]  MRN: [id number]  DOB/AGE:  November 13, 1960 / 53 y.o.  3 Days Post-Op Procedure(s) (LRB): TOTAL HIP ARTHROPLASTY (Left)    PROGRESS NOTE Subjective: Patient is alert, oriented, no nausea, no Vomiting, yes passing gas, no Bowel Movement. Taking PO well. Denies SOB, Chest or Calf Pain. Using Incentive Spirometer, PAS in place. Ambulate WBAT, but only 1:30 feet yesterday secondary to endurance and discomfort Patient reports pain as 4 on 0-10 scale  .    Objective: Vital signs in last 24 hours: Filed Vitals:   04/22/14 0615 04/22/14 1419 04/22/14 2121 04/23/14 0529  BP: 138/86 118/68 108/61 114/75  Pulse: 103 101 95 89  Temp: 99.7 F (37.6 C) 99.7 F (37.6 C) 99.3 F (37.4 C) 98.5 F (36.9 C)  TempSrc: Oral Oral Oral Oral  Resp: 18 18 16 18   Height:      Weight:      SpO2: 98% 98% 98% 99%      Intake/Output from previous day: I/O last 3 completed shifts: In: 240 [P.O.:240] Out: 550 [Urine:550]   Intake/Output this shift:     LABORATORY DATA:  Recent Labs  04/21/14 0600 04/22/14 0610 04/23/14 0643  WBC 12.3* 11.9* 8.5  HGB 10.3* 9.4* 9.3*  HCT 32.3* 29.3* 29.1*  PLT 195 179 166  NA 137  --   --   K 4.2  --   --   CL 103  --   --   CO2 24  --   --   BUN 12  --   --   CREATININE 0.65  --   --   GLUCOSE 141*  --   --   CALCIUM 8.1*  --   --     Examination: Neurologically intact ABD soft Neurovascular intact Sensation intact distally Intact pulses distally Dorsiflexion/Plantar flexion intact Incision: no drainage No cellulitis present Compartment soft} XR AP&Lat of hip shows well placed\fixed THA  Assessment:   3 Days Post-Op Procedure(s) (LRB): TOTAL HIP ARTHROPLASTY (Left) ADDITIONAL DIAGNOSIS:  Hypertension and Morbid super obesity, BMI is greater than 45  Plan: PT/OT WBAT, THA  posterior precautions  DVT Prophylaxis: SCDx72 hrs, ASA 325  mg BID x 2 weeks  DISCHARGE PLAN: Skilled Nursing Facility/Rehab, and fell to a signed by me today and she is cleared for discharge once they find a bed for her.  DISCHARGE NEEDS: HHPT, HHRN, CPM, Walker and 3-in-1 comode seat

## 2014-04-23 NOTE — Progress Notes (Signed)
Seen and agreed 04/23/2014 Jacqualyn Posey PTA [phone] pager [phone] office

## 2014-04-23 NOTE — Progress Notes (Signed)
Seen and agreed 04/23/2014 Jacqualyn Posey PTA [phone] pager [phone] office

## 2014-04-23 NOTE — Progress Notes (Signed)
Occupational Therapy Treatment Patient Details Name: [name] MRN: [id number] DOB: 24-Jul-1961 Today's Date: 04/23/2014    History of present illness Patient is a 53 yo female admitted 04/20/14 now s/p Lt THA with posterior precautions.  Patient with h/o osteoarthritis knees and hips, HTN, obesity, anxiety, depression, tachycardia.   OT comments  Focus of session was on LB bathing using AE, grooming and reviewing compensatory strategies for ADL/IADLs. Pt is progressing towards her goals and would benefit from additional acute OT to practice a tub transfer with sliding glass doors to the shower seat prior to D/C home with intermittent supervision. Will continue to follow.  Follow Up Recommendations  Supervision/Assistance - 24 hour          Precautions / Restrictions Precautions Precautions: Posterior Hip;Fall Precaution Comments: Pt able to state 3/3 precautions. Restrictions LLE Weight Bearing: Weight bearing as tolerated       Mobility Bed Mobility     General bed mobility comments: Pt was in the bathroom at the beginning of tx and left in the recliner at the end of tx.  Transfers Overall transfer level: Needs assistance Equipment used: Rolling walker (2 wheeled) Transfers: Sit to/from Stand Sit to Stand: Supervision         General transfer comment: supervision for safety    Balance Overall balance assessment: Needs assistance Sitting-balance support: Feet supported Sitting balance-Leahy Scale: Good     Standing balance support: During functional activity Standing balance-Leahy Scale: Fair                     ADL Overall ADL's : Needs assistance/impaired     Grooming: Standing;Supervision/safety   Upper Body Bathing: Sitting;Set up   Lower Body Bathing: Sit to/from stand;With adaptive equipment;Adhering to hip precautions;Min guard (using long handled sponge)   Upper Body Dressing : Set up;Sitting                   Functional  mobility during ADLs: Rolling walker;Supervision/safety General ADL Comments: Pt was in bathroom doing bathing and grooming activities upon OT tx. Pt used a long handled sponge to assist with LB bathing with no physical assist needed. Encouraged pt to sit on [facility] to perform LB bathing tasks and to conserve her energy. Pt was issued a long handled sponge and reacher for LB ADLs.                Cognition   Behavior During Therapy: WFL for tasks assessed/performed Overall Cognitive Status: Within Functional Limits for tasks assessed                                    Pertinent Vitals/ Pain       No c/o pain during tx.         Frequency Min 3X/week     Progress Toward Goals  OT Goals(current goals can now be found in the care plan section)  Progress towards OT goals: Progressing toward goals  Acute Rehab OT Goals Patient Stated Goal: To walk better OT Goal Formulation: With patient Time For Goal Achievement: 04/28/14 Potential to Achieve Goals: Good  Plan Discharge plan remains appropriate       End of Session Equipment Utilized During Treatment: Rolling walker   Activity Tolerance Patient tolerated treatment well   Patient Left in chair;with call bell/phone within reach;with family/visitor present  Time:  -     Charges:    Lyda Perone 04/23/2014, 1:31 PM

## 2014-04-24 NOTE — Progress Notes (Signed)
Seen and agreed 04/24/2014 Jacqualyn Posey PTA [phone] pager [phone] office

## 2014-04-24 NOTE — Discharge Summary (Signed)
Patient ID: [name] MRN: [id number] DOB/AGE: 1961-02-06 53 y.o.  Admit date: 04/20/2014 Discharge date: 04/24/2014  Admission Diagnoses:  Principal Problem:   Arthritis of left hip Active Problems:   Arthritis, hip   Discharge Diagnoses:  Same  Past Medical History  Diagnosis Date  . Seasonal allergies   . Anxiety   . Depression   . Hypertension   . Complication of anesthesia     ? anesth. hemmorhaged after c section  . Arthritis     Surgeries: Procedure(s): TOTAL HIP ARTHROPLASTY on 04/20/2014   Consultants:    Discharged Condition: Improved  Hospital Course: [name] is an 53 y.o. female who was admitted 04/20/2014 for operative treatment ofArthritis of left hip. Patient has severe unremitting pain that affects sleep, daily activities, and work/hobbies. After pre-op clearance the patient was taken to the operating room on 04/20/2014 and underwent  Procedure(s): TOTAL HIP ARTHROPLASTY.    Patient was given perioperative antibiotics: Anti-infectives   Start     Dose/Rate Route Frequency Ordered Stop   04/20/14 0600  ceFAZolin (ANCEF) IVPB 2 g/50 mL premix     2 g 100 mL/hr over 30 Minutes Intravenous On call to O.R. 04/19/14 1337 04/20/14 0740       Patient was given sequential compression devices, early ambulation, and chemoprophylaxis to prevent DVT.  Patient benefited maximally from hospital stay and there were no complications.    Recent vital signs: Patient Vitals for the past 24 hrs:  BP Temp Temp src Pulse Resp SpO2  04/24/14 0520 105/69 mmHg 98.3 F (36.8 C) Oral 88 18 98 %  04/24/14 0400 - - - - 16 -  04/24/14 0000 - - - - 16 -  04/23/14 2022 98/57 mmHg 98.6 F (37 C) Oral 100 16 98 %  04/23/14 2000 - - - - 16 98 %  04/23/14 1400 125/74 mmHg 97.8 F (36.6 C) - 93 16 97 %     Recent laboratory studies:  Recent Labs  04/22/14 0610 04/23/14 0643  WBC 11.9* 8.5  HGB 9.4* 9.3*  HCT 29.3* 29.1*  PLT 179 166     Discharge  Medications:     Medication List    STOP taking these medications       HYDROcodone-acetaminophen 5-325 MG per tablet  Commonly known as:  NORCO/VICODIN      TAKE these medications       aspirin EC 325 MG tablet  Take 1 tablet (325 mg total) by mouth 2 (two) times daily.     BC HEADACHE POWDER PO  Take 1 packet by mouth daily as needed (for pain).     cholecalciferol 1000 UNITS tablet  Commonly known as:  VITAMIN D  Take 1,000 Units by mouth every other day.     escitalopram 10 MG tablet  Commonly known as:  LEXAPRO  Take 10 mg by mouth daily.     ibuprofen 200 MG tablet  Commonly known as:  ADVIL,MOTRIN  Take 600 mg by mouth every 6 (six) hours as needed for fever, headache or mild pain.     lisinopril 10 MG tablet  Commonly known as:  PRINIVIL,ZESTRIL  Take 10 mg by mouth daily.     methocarbamol 500 MG tablet  Commonly known as:  ROBAXIN  Take 1 tablet (500 mg total) by mouth every 6 (six) hours as needed.     oxyCODONE-acetaminophen 5-325 MG per tablet  Commonly known as:  ROXICET  Take 1 tablet by mouth  every 4 (four) hours as needed.     vitamin B-12 1000 MCG tablet  Commonly known as:  CYANOCOBALAMIN  Take 1,000 mcg by mouth every other day.        Diagnostic Studies: Dg Chest 2 View  04/09/2014   CLINICAL DATA:  Pre admit for left total hip  EXAM: CHEST  2 VIEW  COMPARISON:  03/24/2013  FINDINGS: Lungs are clear.  No pleural effusion or pneumothorax.  The heart is top-normal in size.  Mild degenerative changes of the visualized thoracolumbar spine.  IMPRESSION: No evidence of acute cardiopulmonary disease.   Electronically Signed   By: Julian Hy M.D.   On: 04/09/2014 14:32   Dg Pelvis Portable  04/20/2014   CLINICAL DATA:  Status post left hip replacement.  EXAM: PORTABLE PELVIS 1-2 VIEWS  COMPARISON:  March 31, 2013.  FINDINGS: Patient is interval status post left total hip arthroplasty. The femoral and acetabular components appear well situated.  Patient is also status post right total hip arthroplasty. Degenerating fibroid is noted in the pelvis.  IMPRESSION: Status post left total hip arthroplasty.   Electronically Signed   By: Sabino Dick M.D.   On: 04/20/2014 10:43    Disposition: 06-Home-Health Care Svc      Discharge Instructions   Call MD / Call 911    Complete by:  As directed   If you experience chest pain or shortness of breath, CALL 911 and be transported to the hospital emergency room.  If you develope a fever above 101 F, pus (white drainage) or increased drainage or redness at the wound, or calf pain, call your surgeon's office.     Call MD / Call 911    Complete by:  As directed   If you experience chest pain or shortness of breath, CALL 911 and be transported to the hospital emergency room.  If you develope a fever above 101 F, pus (white drainage) or increased drainage or redness at the wound, or calf pain, call your surgeon's office.     Change dressing    Complete by:  As directed   PRN     Change dressing    Complete by:  As directed   You may change your dressing on day 5, then change the dressing daily with sterile 4 x 4 inch gauze dressing and paper tape.  You may clean the incision with alcohol prior to redressing     Constipation Prevention    Complete by:  As directed   Drink plenty of fluids.  Prune juice may be helpful.  You may use a stool softener, such as Colace (over the counter) 100 mg twice a day.  Use MiraLax (over the counter) for constipation as needed.     Constipation Prevention    Complete by:  As directed   Drink plenty of fluids.  Prune juice may be helpful.  You may use a stool softener, such as Colace (over the counter) 100 mg twice a day.  Use MiraLax (over the counter) for constipation as needed.     Diet - low sodium heart healthy    Complete by:  As directed      Diet - low sodium heart healthy    Complete by:  As directed      Discharge instructions    Complete by:  As directed    Follow up in office with Dr. Mayer Camel in 2 weeks.     Driving restrictions    Complete by:  As directed   No driving for 2 weeks     Follow the hip precautions as taught in Physical Therapy    Complete by:  As directed      Follow the hip precautions as taught in Physical Therapy    Complete by:  As directed      Increase activity slowly as tolerated    Complete by:  As directed      Increase activity slowly as tolerated    Complete by:  As directed      Patient may shower    Complete by:  As directed   You may shower without a dressing once there is no drainage.  Do not wash over the wound.  If drainage remains, cover wound with plastic wrap and then shower.           Follow-up Information   Follow up with Kerin Salen, MD In 2 weeks.   Specialty:  Orthopedic Surgery   Contact information:   [place] [address] [phone]       Follow up with [facility]. (Someone from [facility] will contact you concerning start date and time for physical therapy.)    Contact information:   [address] [place] [address] [phone]       Follow up with Kerin Salen, MD In 1 week.   Specialty:  Orthopedic Surgery   Contact information:   [place] [address] [phone]        Signed: Hardin Negus Taseen Marasigan R 04/24/2014, 7:59 AM

## 2014-04-24 NOTE — Progress Notes (Signed)
I have read and agree with this note.   Time in/out: 9:57-1007 Total time: 10 minute ([place])  [address], OTR/L [phone]

## 2014-04-24 NOTE — Progress Notes (Signed)
Physical Therapy Treatment Patient Details Name: [name] MRN: [id number] DOB: [birth year] Today's Date: 04/24/2014    History of Present Illness Patient is a 52 yo female admitted 04/20/14 now s/p Lt THA with posterior precautions.  Patient with h/o osteoarthritis knees and hips, HTN, obesity, anxiety, depression, tachycardia.    PT Comments    Pt more motivated to get up this session. Worked on stair training this session, pt is safe to do stairs on own at home along with amb. Left pt in gym with OT to begin her session. Pt planning to D/C home this afternoon.   Follow Up Recommendations  Home health PT     Equipment Recommendations  None recommended by PT    Recommendations for Other Services       Precautions / Restrictions Precautions Precautions: Posterior Hip;Fall Precaution Comments: Pt able to state 3/3 precautions. Restrictions Weight Bearing Restrictions: Yes LLE Weight Bearing: Weight bearing as tolerated    Mobility  Bed Mobility Overal bed mobility: Needs Assistance Bed Mobility: Supine to Sit     Supine to sit: Min assist     General bed mobility comments: min assist to bring UE to sitting. pt needed to hold and pull on me for assist and used hand rails.   Transfers Overall transfer level: Needs assistance Equipment used: Rolling walker (2 wheeled) Transfers: Sit to/from Stand Sit to Stand: Supervision         General transfer comment: supervision for safety. Pt can independently stand at this time.   Ambulation/Gait                 Stairs Stairs: Yes Stairs assistance: Min guard Stair Management: No rails;Step to pattern;Backwards Number of Stairs: 3 General stair comments: Pt did well with stairs and remembering instructions.  Wheelchair Mobility    Modified Rankin (Stroke Patients Only)       Balance                                    Cognition Arousal/Alertness: Awake/alert Behavior During  Therapy: WFL for tasks assessed/performed Overall Cognitive Status: Within Functional Limits for tasks assessed                      Exercises      General Comments        Pertinent Vitals/Pain 7/10. Pt medicated at beginning of session per RN.    Home Living                      Prior Function            PT Goals (current goals can now be found in the care plan section) Progress towards PT goals: Progressing toward goals    Frequency  7X/week    PT Plan      Co-evaluation             End of Session Equipment Utilized During Treatment: Gait belt Activity Tolerance: Patient tolerated treatment well Patient left: Other (comment)     Time: 5625-6389 PT Time Calculation (min): 16 min  Charges:                       G Codes:      [name],[name],SPTA 04/24/2014, 10:04 AM

## 2014-04-24 NOTE — Progress Notes (Signed)
PATIENT ID: [name]  MRN: [id number]  DOB/AGE:  10-09-1960 / 53 y.o.  4 Days Post-Op Procedure(s) (LRB): TOTAL HIP ARTHROPLASTY (Left)    PROGRESS NOTE Subjective: Patient is alert, oriented, no Nausea, no Vomiting, yes passing gas, no Bowel Movement. Taking PO well. Denies SOB, Chest or Calf Pain. Using Incentive Spirometer, PAS in place. Ambulate WBAT, pt walked 150 and practiced steps yesterday. Patient reports pain as 7 on 0-10 scale  .    Objective: Vital signs in last 24 hours: Filed Vitals:   04/23/14 2022 04/24/14 0000 04/24/14 0400 04/24/14 0520  BP: 98/57   105/69  Pulse: 100   88  Temp: 98.6 F (37 C)   98.3 F (36.8 C)  TempSrc: Oral   Oral  Resp: 16 16 16 18   Height:      Weight:      SpO2: 98%   98%      Intake/Output from previous day: I/O last 3 completed shifts: In: 240 [P.O.:240] Out: 250 [Urine:250]   Intake/Output this shift:     LABORATORY DATA:  Recent Labs  04/22/14 0610 04/23/14 0643  WBC 11.9* 8.5  HGB 9.4* 9.3*  HCT 29.3* 29.1*  PLT 179 166    Examination: Neurologically intact Neurovascular intact Sensation intact distally Intact pulses distally Dorsiflexion/Plantar flexion intact Incision: dressing C/D/I No cellulitis present Compartment soft} XR AP&Lat of hip shows well placed\fixed THA  Assessment:   4 Days Post-Op Procedure(s) (LRB): TOTAL HIP ARTHROPLASTY (Left) ADDITIONAL DIAGNOSIS:  Hypertension and obesity   Plan: PT/OT WBAT, THA  posterior precautions  DVT Prophylaxis: SCDx72 hrs, ASA 325 mg BID x 2 weeks  DISCHARGE PLAN: Home later today after therapy  DISCHARGE NEEDS: HHPT, HHRN, Walker and 3-in-1 comode seat

## 2014-04-24 NOTE — Progress Notes (Signed)
Occupational Therapy Treatment and Discharge Patient Details Name: [name] MRN: [id number] DOB: 07-03-1961 Today's Date: 04/24/2014    History of present illness Patient is a 53 yo female admitted 04/20/14 now s/p Lt THA with posterior precautions.  Patient with h/o osteoarthritis knees and hips, HTN, obesity, anxiety, depression, tachycardia.   OT comments  Focus of today's session was on demonstrating a tub transfer to a shower seat and reviewing LB ADL technique and sequence. All education has been completed and pt feels confident in self care tasks with supervision and assistance as needed at home. No further OT is needed, we will sign off.  Follow Up Recommendations  Supervision/Assistance - 24 hour    Equipment Recommendations   (pt now has all DME/AE needs)       Precautions / Restrictions Precautions Precautions: Posterior Hip;Fall Precaution Comments: Pt able to state 3/3 precautions. Restrictions Weight Bearing Restrictions: Yes LLE Weight Bearing: Weight bearing as tolerated       Mobility Bed Mobility     General bed mobility comments: Pt in gym with PT at the start of OT session.  Transfers Overall transfer level: Needs assistance Equipment used: Rolling walker (2 wheeled) Transfers: Sit to/from Stand Sit to Stand: Supervision         General transfer comment: supervision for safety. Pt can independently stand at this time without VC for LLE placement    Balance Overall balance assessment: Needs assistance Sitting-balance support: Feet supported Sitting balance-Leahy Scale: Good       Standing balance-Leahy Scale: Fair                     ADL Overall ADL's : Needs assistance/impaired                                 Tub/ Shower Transfer: Tub transfer;Minimal assistance;Ambulation;Shower seat;Rolling walker;Adhering to hip precautions   Functional mobility during ADLs: Rolling walker;Supervision/safety General ADL  Comments: Demonstrated proper tub transfer technique with sliding doors to a shower chair for pt. Pt was unable to practice transfer due to limited ROM but stated that she "felt confident in the technique for how to do it". Pt will be doing a sponge bath until she feels safe and able to get into the tub to shower. Reviewed LB ADL sequencing and technique and the use of AE to assist with those tasks.                Cognition   Behavior During Therapy: WFL for tasks assessed/performed Overall Cognitive Status: Within Functional Limits for tasks assessed                                    Pertinent Vitals/ Pain       No c/o pain during tx.          Frequency Min 3X/week     Progress Toward Goals  OT Goals(current goals can now be found in the care plan section)  Progress towards OT goals:  (All education is complete)  Acute Rehab OT Goals Patient Stated Goal: To walk better OT Goal Formulation: With patient Time For Goal Achievement: 04/28/14 Potential to Achieve Goals: Good  Plan Discharge plan remains appropriate       End of Session Equipment Utilized During Treatment: Rolling walker   Activity Tolerance Patient tolerated treatment  well   Patient Left in chair;with call bell/phone within reach           Time:  -     Charges:    Lyda Perone 04/24/2014, 11:36 AM

## 2014-08-26 IMAGING — CR DG HIP 1V PORT*R*
1 series · 1 of 1 positions shown · non-contrast
Comparison: Radiographs dated 11/18/2010

CLINICAL DATA: Osteoarthritis of the right hip.

PORTABLE RIGHT HIP - 1 VIEW

[lateral]
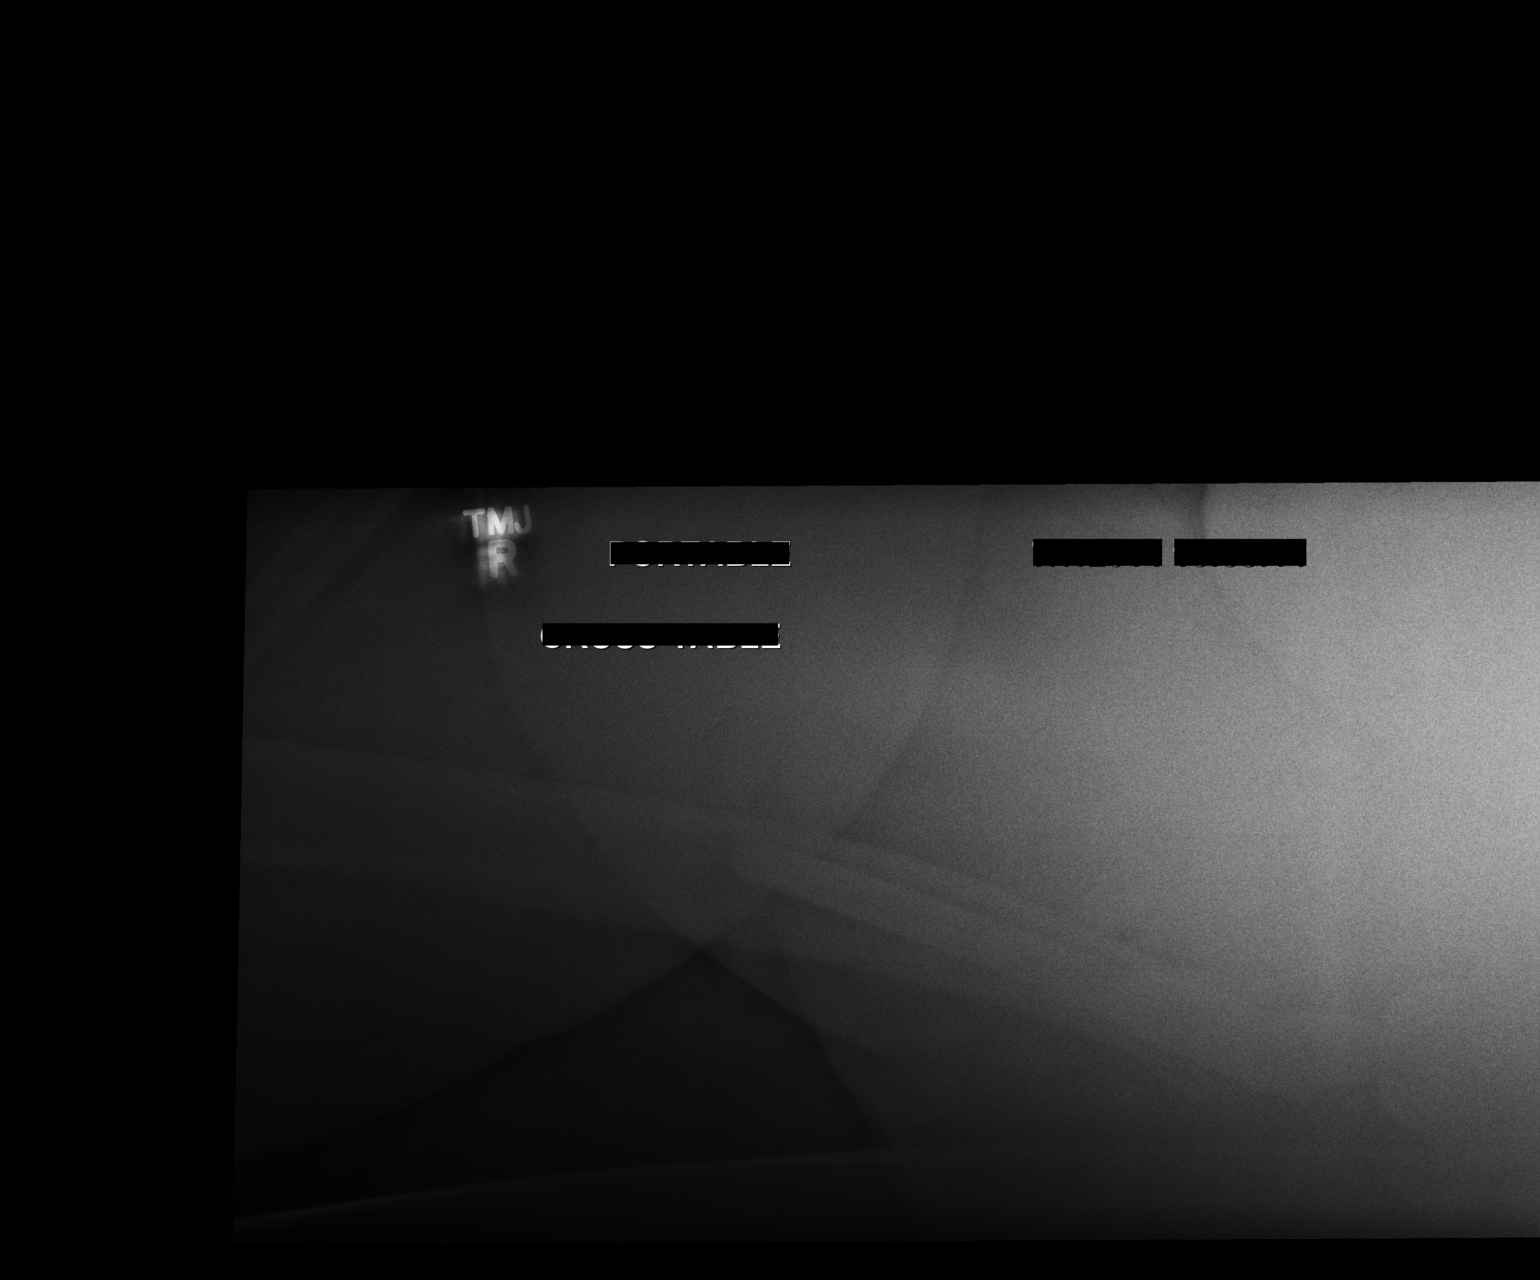

[1 of 1 positions shown; findings below may reference images not displayed]

FINDINGS: The patient has undergone right total hip prosthesis
insertion.  The femoral component is well seen on the lateral view
and appears in good position with no fracture.  Acetabular
component is obscured by the soft tissues.
IMPRESSION: Satisfactory appearance of the femoral prosthesis in the lateral
projection.

## 2014-08-26 IMAGING — CR DG PORTABLE PELVIS
1 series · 1 of 1 positions shown · non-contrast
Comparison: None.

CLINICAL DATA: Osteoarthritis of the right hip.

PORTABLE PELVIS

[AP]
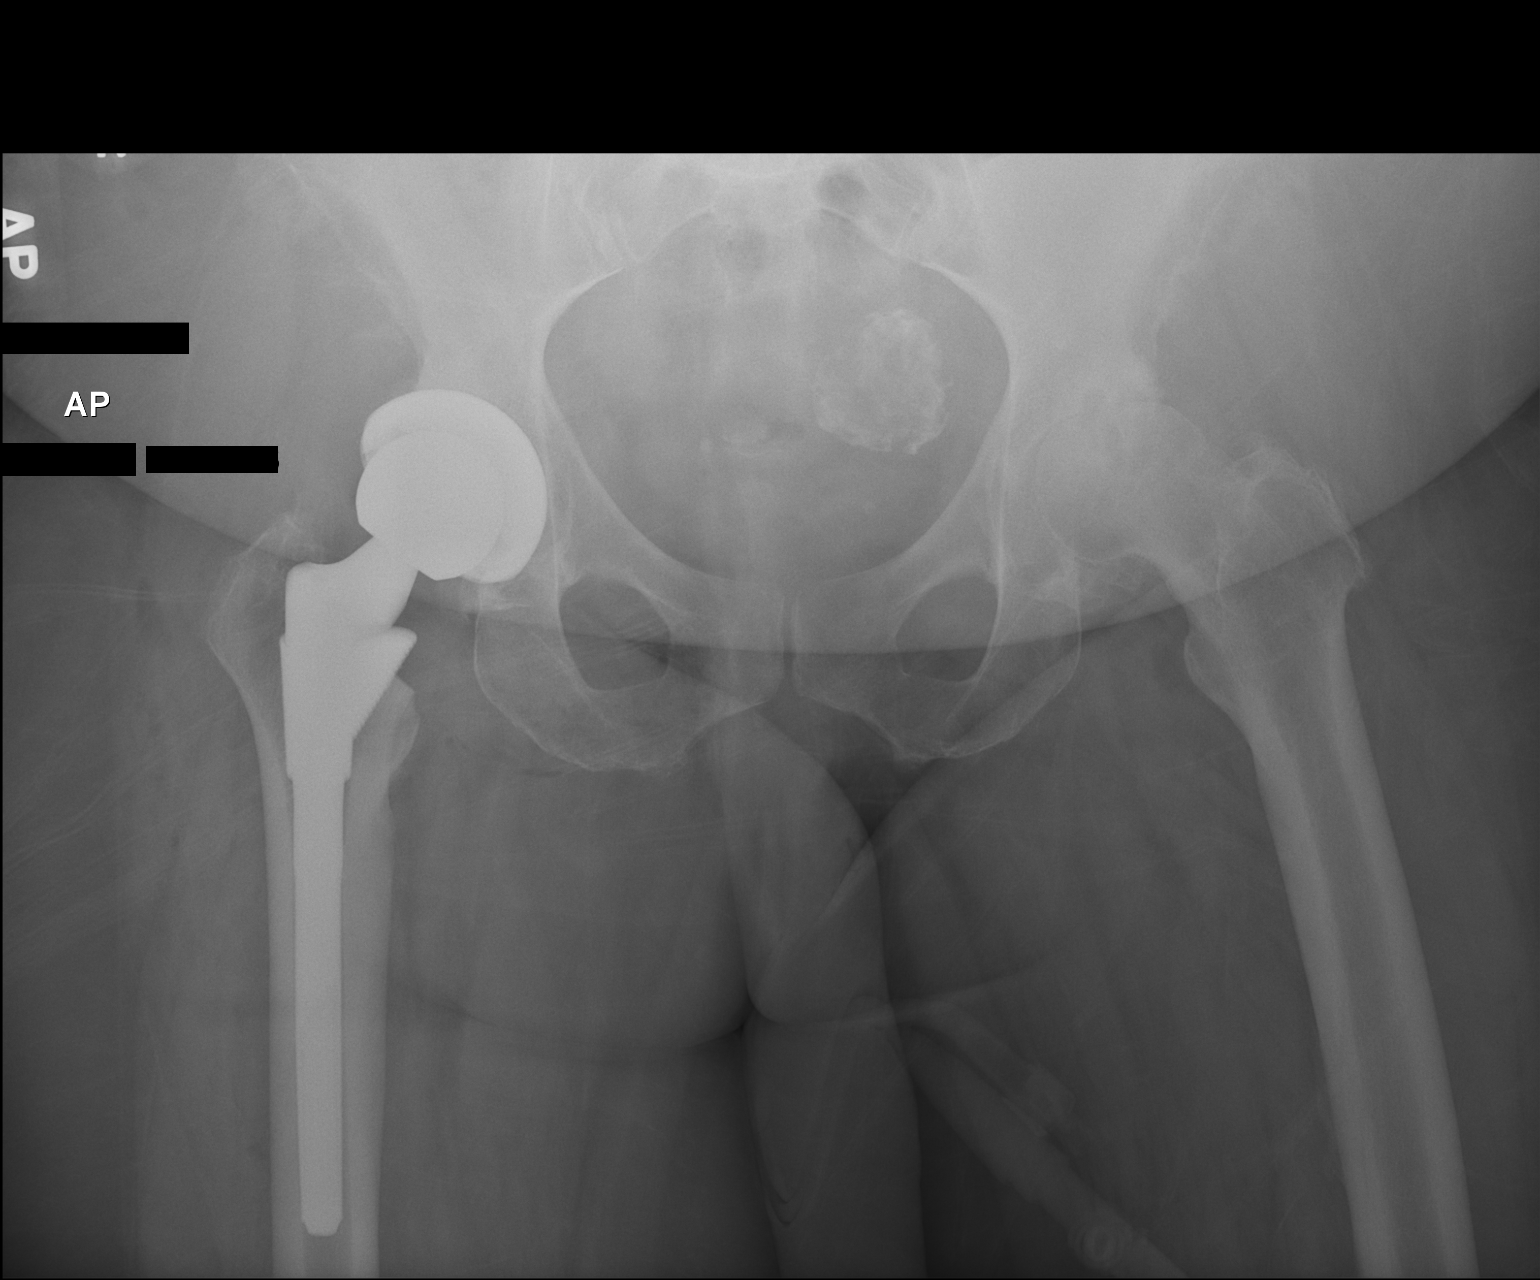

[1 of 1 positions shown; findings below may reference images not displayed]

FINDINGS: The patient has undergone right total hip prosthesis
insertion.  Acetabular and femoral components appear in excellent
position in the AP projection.  No fractures.

Severe arthritis of the left hip is noted.  Calcified fibroid in
the pelvis.
IMPRESSION: Satisfactory appearance of the right hip in the AP projection after
total hip prosthesis insertion.

## 2015-01-05 ENCOUNTER — Other Ambulatory Visit: Payer: Self-pay | Admitting: Obstetrics and Gynecology

## 2015-01-06 LAB — CYTOLOGY - PAP

## 2018-04-26 ENCOUNTER — Other Ambulatory Visit: Payer: Self-pay | Admitting: Physician Assistant

## 2018-04-26 DIAGNOSIS — H93A1 Pulsatile tinnitus, right ear: Secondary | ICD-10-CM

## 2018-08-09 ENCOUNTER — Encounter (HOSPITAL_COMMUNITY): Payer: Self-pay | Admitting: Emergency Medicine

## 2018-08-09 ENCOUNTER — Other Ambulatory Visit: Payer: Self-pay

## 2018-08-09 ENCOUNTER — Ambulatory Visit (HOSPITAL_COMMUNITY)
Admission: EM | Admit: 2018-08-09 | Discharge: 2018-08-09 | Disposition: A | Discharge status: Home / Self Care | Payer: Worker's Compensation | Attending: Internal Medicine | Admitting: Internal Medicine

## 2018-08-09 DIAGNOSIS — W19XXXA Unspecified fall, initial encounter: Secondary | ICD-10-CM

## 2018-08-09 DIAGNOSIS — Y92219 Unspecified school as the place of occurrence of the external cause: Secondary | ICD-10-CM

## 2018-08-09 DIAGNOSIS — S8002XA Contusion of left knee, initial encounter: Secondary | ICD-10-CM

## 2018-08-09 DIAGNOSIS — I10 Essential (primary) hypertension: Secondary | ICD-10-CM | POA: Diagnosis not present

## 2018-08-09 MED ORDER — NAPROXEN 500 MG PO TABS: 500.0000 mg | ORAL_TABLET | Freq: Two times a day (BID) | ORAL | 0 refills | Status: AC

## 2018-08-09 NOTE — ED Triage Notes (Signed)
Pt fell at work yesterday onto her left knee. Pt is having pain in the knee since she fell.

## 2018-08-09 NOTE — Discharge Instructions (Addendum)
Anticipate gradual improvement in pain/swelling over the next several days; may take a couple weeks to resolve completely.  Prescription for naproxen (anti inflammatory/pain reliever) was sent to the pharmacy.  Ice for 5-10 minutes several times daily may also help decrease pain/swelling.  Ok to return to work on Monday 08/12/18; followup with Occupational Health if not able to do so, for recheck.

## 2018-08-09 NOTE — ED Provider Notes (Signed)
[place]    CSN: [id number] Arrival date & time: 08/09/18  1627     History   Chief Complaint Chief Complaint  Patient presents with  . Knee Injury    left    HPI [name] is a 57 y.o. female. Pt is a substitute Education officer, museum.  Tripped and fell forward and to her left in a crowded hallway yesterday around noon.  Was able to get up and didn't feel too bad, but over the next several hours felt more pain in left knee.  Able to weight bear/walk, able to flex knee, but painful to rise from seated position.  Was advised to come get this checked out today.  No other injury reported.   HPI  Past Medical History:  Diagnosis Date  . Anxiety   . Arthritis   . Complication of anesthesia    ? anesth. hemmorhaged after c section  . Depression   . Hypertension   . Seasonal allergies     Patient Active Problem List   Diagnosis Date Noted  . Arthritis, hip 04/20/2014  . Arthritis of left hip 04/19/2014  . Osteoarthritis of right hip 08/09/2012  . Osteoarthritis of both knees 08/09/2012  . Anxiety and depression 08/09/2012  . ARTHRITIS, HIPS, BILATERAL 11/22/2010  . PAIN IN JOINT, MULTIPLE SITES 03/02/2010  . URI 08/27/2009  . SINUSITIS 07/01/2009  . LEG PAIN, RIGHT 06/24/2009  . TACHYCARDIA 06/24/2009  . ALLERGIC RHINITIS 01/20/2009  . HYPERCHOLESTEROLEMIA 10/05/2008  . CANDIDIASIS 10/01/2008  . FIBROIDS, UTERUS 10/01/2008  . MICROSCOPIC HEMATURIA 10/01/2008  . OBESITY 06/17/2008  . ANXIETY DISORDER 06/17/2008  . TOBACCO ABUSE 06/17/2008  . HYPERTENSION, BENIGN ESSENTIAL 09/25/2006    Past Surgical History:  Procedure Laterality Date  . CESAREAN SECTION    . COLONOSCOPY    . POLYPECTOMY    . 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 TOTAL HIP ARTHROPLASTY Left 04/20/2014   Procedure: TOTAL HIP ARTHROPLASTY;  Surgeon: Kerin Salen, MD;  Location: [facility];  Service: Orthopedics;  Laterality: Left;      Home Medications    Prior to Admission medications   Medication Sig Start Date End Date Taking? Authorizing Provider  Aspirin-Salicylamide-Caffeine (BC HEADACHE POWDER PO) Take 1 packet by mouth daily as needed (for pain).   Yes [provider]  cholecalciferol (VITAMIN D) 1000 UNITS tablet Take 1,000 Units by mouth every other day.   Yes [provider]  escitalopram (LEXAPRO) 10 MG tablet Take 10 mg by mouth daily.     Yes [provider]  ibuprofen (ADVIL,MOTRIN) 200 MG tablet Take 600 mg by mouth every 6 (six) hours as needed for fever, headache or mild pain.   Yes [provider]  lisinopril (PRINIVIL,ZESTRIL) 10 MG tablet Take 10 mg by mouth daily.   Yes [provider]  methocarbamol (ROBAXIN) 500 MG tablet Take 1 tablet (500 mg total) by mouth every 6 (six) hours as needed. 04/20/14  Yes Leighton Parody, PA-C  Multiple Vitamin (THERA) TABS Take by mouth.   Yes [provider]  potassium chloride (K-DUR) 10 MEQ tablet TAKE 1 TABLET(10 MEQ) BY MOUTH DAILY 01/24/18  Yes [provider]  vitamin B-12 (CYANOCOBALAMIN) 1000 MCG tablet Take 1,000 mcg by mouth every other day.   Yes [provider]  aspirin EC  325 MG tablet Take 1 tablet (325 mg total) by mouth 2 (two) times daily. 04/20/14   Leighton Parody, PA-C  naproxen (NAPROSYN) 500 MG tablet Take 1 tablet (500 mg total) by mouth 2 (two) times daily. 08/09/18   Wynona Luna, MD    Family History Family History  Problem Relation Age of Onset  . Dementia Mother   . Hypertension Mother   . Diabetes Mother   . Dementia Father   . Heart disease Father     Social History Social History   Tobacco Use  . Smoking status: Current Every Day Smoker    Packs/day: 0.25    Years: 25.00    Pack years: 6.25    Types: Cigarettes  .  Smokeless tobacco: Never Used  Substance Use Topics  . Alcohol use: Yes    Alcohol/week: 2.0 standard drinks    Types: 2 drink(s) per week    Comment: social  . Drug use: No     Allergies   Other 'does not do well with anesthesia'  Review of Systems Review of Systems  All other systems reviewed and are negative.    Physical Exam Triage Vital Signs ED Triage Vitals [08/09/18 1718]  Enc Vitals Group     BP (!) 139/94     Pulse Rate 80     Resp      Temp 98.4 F (36.9 C)     Temp Source Oral     SpO2 97 %     Weight      Height      Pain Score 7     Pain Loc    Updated Vital Signs BP (!) 139/94 (BP Location: Left Arm)   Pulse 80   Temp 98.4 F (36.9 C) (Oral)   SpO2 97%  Physical Exam  Constitutional: She is oriented to person, place, and time. No distress.  HENT:  Head: Atraumatic.  Eyes:  Conjugate gaze observed, no eye redness/discharge  Neck: Neck supple.  Cardiovascular: Normal rate.  Pulmonary/Chest: No respiratory distress.  Abdominal: She exhibits no distension.  Musculoskeletal: Normal range of motion.  Knees are large and with symetric knee appearance bilaterally.  Mild focal tenderness left knee lateral/slightly inferior to patella.Patient able to flex left knee symmetric with flexion in right knee, stand on left leg while flexing right knee, stand from chair unassisted, walked into urgent care independently.  No bruising.  Skin intact.    Neurological: She is alert and oriented to person, place, and time.  Skin: Skin is warm and dry.  Nursing note and vitals reviewed.     Final Clinical Impressions(s) / UC Diagnoses   Final diagnoses:  Contusion of left knee, initial encounter     Discharge Instructions     Anticipate gradual improvement in pain/swelling over the next several days; may take a couple weeks to resolve completely.  Prescription for naproxen (anti inflammatory/pain reliever) was sent to the pharmacy.  Ice for 5-10 minutes  several times daily may also help decrease pain/swelling.  Ok to return to work on Monday 08/12/18; followup with Occupational Health if not able to do so, for recheck.  WC paperwork presented by patient completed and returned to patient.    ED Prescriptions    Medication Sig Dispense Auth. Provider   naproxen (NAPROSYN) 500 MG tablet Take 1 tablet (500 mg total) by mouth 2 (two) times daily. 30 tablet Wynona Luna, MD        Wynona Luna,  MD 08/10/18 3406

## 2018-12-10 ENCOUNTER — Emergency Department (HOSPITAL_COMMUNITY): Payer: Medicaid Other

## 2018-12-10 ENCOUNTER — Encounter (HOSPITAL_COMMUNITY): Payer: Self-pay | Admitting: Emergency Medicine

## 2018-12-10 ENCOUNTER — Emergency Department (HOSPITAL_COMMUNITY)
Admission: EM | Admit: 2018-12-10 | Discharge: 2018-12-10 | Disposition: A | Discharge status: Home / Self Care | Payer: Medicaid Other | Attending: Emergency Medicine | Admitting: Emergency Medicine

## 2018-12-10 ENCOUNTER — Other Ambulatory Visit: Payer: Self-pay

## 2018-12-10 DIAGNOSIS — F17210 Nicotine dependence, cigarettes, uncomplicated: Secondary | ICD-10-CM | POA: Insufficient documentation

## 2018-12-10 DIAGNOSIS — Z79899 Other long term (current) drug therapy: Secondary | ICD-10-CM | POA: Diagnosis not present

## 2018-12-10 DIAGNOSIS — I10 Essential (primary) hypertension: Secondary | ICD-10-CM | POA: Diagnosis not present

## 2018-12-10 DIAGNOSIS — Z7982 Long term (current) use of aspirin: Secondary | ICD-10-CM | POA: Insufficient documentation

## 2018-12-10 DIAGNOSIS — R072 Precordial pain: Secondary | ICD-10-CM | POA: Diagnosis not present

## 2018-12-10 DIAGNOSIS — R0789 Other chest pain: Secondary | ICD-10-CM | POA: Diagnosis present

## 2018-12-10 LAB — COMPREHENSIVE METABOLIC PANEL
ALT: 13 U/L (ref 0–44)
AST: 14 U/L — AB (ref 15–41)
Albumin: 3.1 g/dL — ABNORMAL LOW (ref 3.5–5.0)
Alkaline Phosphatase: 73 U/L (ref 38–126)
Anion gap: 8 (ref 5–15)
BUN: 21 mg/dL — AB (ref 6–20)
CO2: 26 mmol/L (ref 22–32)
Calcium: 8.3 mg/dL — ABNORMAL LOW (ref 8.9–10.3)
Chloride: 105 mmol/L (ref 98–111)
Creatinine, Ser: 0.98 mg/dL (ref 0.44–1.00)
GFR calc Af Amer: >60 mL/min (ref >60)
GFR calc non Af Amer: >60 mL/min (ref >60)
GLUCOSE: 119 mg/dL — AB (ref 70–99)
POTASSIUM: 3.8 mmol/L (ref 3.5–5.1)
Sodium: 139 mmol/L (ref 135–145)
Total Bilirubin: 0.3 mg/dL (ref 0.3–1.2)
Total Protein: 6.7 g/dL (ref 6.5–8.1)

## 2018-12-10 LAB — CBC
HCT: 40.7 % (ref 36.0–46.0)
HEMOGLOBIN: 12.5 g/dL (ref 12.0–15.0)
MCH: 29 pg (ref 26.0–34.0)
MCHC: 30.7 g/dL (ref 30.0–36.0)
MCV: 94.4 fL (ref 80.0–100.0)
Platelets: 178 10*3/uL (ref 150–400)
RBC: 4.31 MIL/uL (ref 3.87–5.11)
RDW: 14 % (ref 11.5–15.5)
WBC: 8.9 10*3/uL (ref 4.0–10.5)
nRBC: 0 % (ref 0.0–0.2)

## 2018-12-10 LAB — I-STAT BETA HCG BLOOD, ED (MC, WL, AP ONLY): I-stat hCG, quantitative: <5 m[IU]/mL (ref <5)

## 2018-12-10 LAB — I-STAT TROPONIN, ED: Troponin i, poc: 0 ng/mL (ref 0.00–0.08)

## 2018-12-10 MED ORDER — FAMOTIDINE 20 MG PO TABS
20.0000 mg | ORAL_TABLET | Freq: Once | ORAL | Status: AC
  Administered 2018-12-10: 20 mg via ORAL
  Filled 2018-12-10: qty 1

## 2018-12-10 MED ORDER — ALUM & MAG HYDROXIDE-SIMETH 200-200-20 MG/5ML PO SUSP
30.0000 mL | Freq: Once | ORAL | Status: AC
  Administered 2018-12-10: 30 mL via ORAL
  Filled 2018-12-10: qty 30

## 2018-12-10 NOTE — ED Notes (Signed)
ED Provider at bedside. 

## 2018-12-10 NOTE — Discharge Instructions (Addendum)
Please read and follow all provided instructions.  Your diagnoses today include:  1. Precordial pain     Tests performed today include:  An EKG of your heart  A chest x-ray  Cardiac enzymes - a blood test for heart muscle damage  Blood counts and electrolytes  Vital signs. See below for your results today.   Medications prescribed:   None  Take any prescribed medications only as directed.  Follow-up instructions: Please follow-up with your primary care provider as soon as you can for further evaluation of your symptoms.   Return instructions:  SEEK IMMEDIATE MEDICAL ATTENTION IF:  You have severe chest pain, especially if the pain is crushing or pressure-like and spreads to the arms, back, neck, or jaw, or if you have sweating, nausea (feeling sick to your stomach), or shortness of breath. THIS IS AN EMERGENCY. Don't wait to see if the pain will go away. Get medical help at once. Call 911 or 0 (operator). DO NOT drive yourself to the hospital.   Your chest pain gets worse and does not go away with rest.   You have an attack of chest pain lasting longer than usual, despite rest and treatment with the medications your caregiver has prescribed.   You wake from sleep with chest pain or shortness of breath.  You feel dizzy or faint.  You have chest pain not typical of your usual pain for which you originally saw your caregiver.   You have any other emergent concerns regarding your health.  Additional Information: Chest pain comes from many different causes. Your caregiver has diagnosed you as having chest pain that is not specific for one problem, but does not require admission.  You are at low risk for an acute heart condition or other serious illness.   Your vital signs today were: BP (!) 144/97    Pulse 62    Temp 98.4 F (36.9 C) (Oral)    Resp 16    Ht 5\' 2"  (1.575 m)    Wt 127 kg    SpO2 96%    BMI 51.21 kg/m  If your blood pressure (BP) was elevated above 135/85  this visit, please have this repeated by your doctor within one month. --------------

## 2018-12-10 NOTE — ED Provider Notes (Signed)
[facility] EMERGENCY DEPARTMENT Provider Note   CSN: [id number] Arrival date & time: 12/10/18  1736    History   Chief Complaint No chief complaint on file.   HPI [name] is a 58 y.o. female.     Patient with history of hypertension, smoking presents the emergency department with complaint of chest pain.  Patient states that she had fried chicken on Friday night (today is Tuesday).  On Sunday morning she developed heartburn type pain in her mid chest into her back and right shoulder.  This was not associated with exertion, vomiting, diaphoresis.  She did not have any abdominal pain or diarrhea.  Patient took several doses of Gas-X which helped her symptoms a lot.  However they did not completely resolve so she called her doctor today.  When they got back to her, she was told to come to the emergency department (presumedly to have cardiac evaluation).  Patient denies fever or cough.  Patient denies risk factors for pulmonary embolism including: unilateral leg swelling, history of DVT/PE/other blood clots, use of exogenous hormones, recent immobilizations, recent surgery, recent travel (>4hr segment), malignancy, hemoptysis.  Pain is very minimal at the current time described as a "drawing up".  She thinks her symptoms are resolving.  No previous pain with exertion or otherwise in the chest.      Past Medical History:  Diagnosis Date  . Anxiety   . Arthritis   . Complication of anesthesia    ? anesth. hemmorhaged after c section  . Depression   . Hypertension   . Seasonal allergies     Patient Active Problem List   Diagnosis Date Noted  . Arthritis, hip 04/20/2014  . Arthritis of left hip 04/19/2014  . Osteoarthritis of right hip 08/09/2012  . Osteoarthritis of both knees 08/09/2012  . Anxiety and depression 08/09/2012  . ARTHRITIS, HIPS, BILATERAL 11/22/2010  . PAIN IN JOINT, MULTIPLE SITES 03/02/2010  . URI 08/27/2009  . SINUSITIS 07/01/2009   . LEG PAIN, RIGHT 06/24/2009  . TACHYCARDIA 06/24/2009  . ALLERGIC RHINITIS 01/20/2009  . HYPERCHOLESTEROLEMIA 10/05/2008  . CANDIDIASIS 10/01/2008  . FIBROIDS, UTERUS 10/01/2008  . MICROSCOPIC HEMATURIA 10/01/2008  . OBESITY 06/17/2008  . ANXIETY DISORDER 06/17/2008  . TOBACCO ABUSE 06/17/2008  . HYPERTENSION, BENIGN ESSENTIAL 09/25/2006    Past Surgical History:  Procedure Laterality Date  . CESAREAN SECTION    . COLONOSCOPY    . POLYPECTOMY    . TOTAL HIP ARTHROPLASTY Right 03/31/2013   Procedure: TOTAL HIP ARTHROPLASTY;  Surgeon: Kerin Salen, MD;  Location: [place];  Service: Orthopedics;  Laterality: Right;  DEPUY/SROM RIGHT TOTAL HIP ARTHROPLASTY  . TOTAL HIP ARTHROPLASTY Left 04/20/2014   Procedure: TOTAL HIP ARTHROPLASTY;  Surgeon: Kerin Salen, MD;  Location: [facility];  Service: Orthopedics;  Laterality: Left;     OB History   No obstetric history on file.      Home Medications    Prior to Admission medications   Medication Sig Start Date End Date Taking? Authorizing Provider  aspirin EC 325 MG tablet Take 1 tablet (325 mg total) by mouth 2 (two) times daily. 04/20/14   Leighton Parody, PA-C  Aspirin-Salicylamide-Caffeine (BC HEADACHE POWDER PO) Take 1 packet by mouth daily as needed (for pain).    [provider]  cholecalciferol (VITAMIN D) 1000 UNITS tablet Take 1,000 Units by mouth every other day.    [provider]  escitalopram (LEXAPRO) 10 MG tablet Take 10  mg by mouth daily.      [provider]  ibuprofen (ADVIL,MOTRIN) 200 MG tablet Take 600 mg by mouth every 6 (six) hours as needed for fever, headache or mild pain.    [provider]  lisinopril (PRINIVIL,ZESTRIL) 10 MG tablet Take 10 mg by mouth daily.    [provider]  methocarbamol (ROBAXIN) 500 MG tablet Take 1 tablet (500 mg total) by mouth every 6 (six) hours as needed. 04/20/14   Leighton Parody, PA-C  Multiple Vitamin (THERA) TABS Take by mouth.     [provider]  naproxen (NAPROSYN) 500 MG tablet Take 1 tablet (500 mg total) by mouth 2 (two) times daily. 08/09/18   Wynona Luna, MD  potassium chloride (K-DUR) 10 MEQ tablet TAKE 1 TABLET(10 MEQ) BY MOUTH DAILY 01/24/18   [provider]  vitamin B-12 (CYANOCOBALAMIN) 1000 MCG tablet Take 1,000 mcg by mouth every other day.    [provider]    Family History Family History  Problem Relation Age of Onset  . Dementia Mother   . Hypertension Mother   . Diabetes Mother   . Dementia Father   . Heart disease Father     Social History Social History   Tobacco Use  . Smoking status: Current Every Day Smoker    Packs/day: 0.25    Years: 25.00    Pack years: 6.25    Types: Cigarettes  . Smokeless tobacco: Never Used  Substance Use Topics  . Alcohol use: Yes    Alcohol/week: 2.0 standard drinks    Types: 2 Standard drinks or equivalent per week    Comment: social  . Drug use: No     Allergies   Other   Review of Systems Review of Systems  Constitutional: Negative for diaphoresis and fever.  Eyes: Negative for redness.  Respiratory: Negative for cough and shortness of breath.   Cardiovascular: Positive for chest pain. Negative for palpitations and leg swelling.  Gastrointestinal: Negative for abdominal pain, nausea and vomiting.  Genitourinary: Negative for dysuria.  Musculoskeletal: Positive for back pain. Negative for neck pain.  Skin: Negative for rash.  Neurological: Negative for syncope and light-headedness.  Psychiatric/Behavioral: The patient is not nervous/anxious.      Physical Exam Updated Vital Signs BP (!) 164/94 (BP Location: Right Arm)   Pulse 72   Temp 98.4 F (36.9 C) (Oral)   Resp 15   Ht 5\' 2"  (1.575 m)   Wt 127 kg   SpO2 98%   BMI 51.21 kg/m   Physical Exam Vitals signs and nursing note reviewed.  Constitutional:      Appearance: She is well-developed. She is not diaphoretic.  HENT:     Head:  Normocephalic and atraumatic.     Mouth/Throat:     Mouth: Mucous membranes are not dry.  Eyes:     Conjunctiva/sclera: Conjunctivae normal.  Neck:     Musculoskeletal: Normal range of motion and neck supple. No muscular tenderness.     Vascular: Normal carotid pulses. No carotid bruit or JVD.     Trachea: Trachea normal. No tracheal deviation.  Cardiovascular:     Rate and Rhythm: Normal rate and regular rhythm.     Pulses: No decreased pulses.     Heart sounds: Normal heart sounds, S1 normal and S2 normal. No murmur.  Pulmonary:     Effort: Pulmonary effort is normal. No respiratory distress.     Breath sounds: No wheezing.  Chest:  Chest wall: No tenderness.  Abdominal:     General: Bowel sounds are normal.     Palpations: Abdomen is soft.     Tenderness: There is no abdominal tenderness. There is no guarding or rebound.  Musculoskeletal: Normal range of motion.  Skin:    General: Skin is warm and dry.     Coloration: Skin is not pale.  Neurological:     Mental Status: She is alert.      ED Treatments / Results  Labs (all labs ordered are listed, but only abnormal results are displayed) Labs Reviewed  COMPREHENSIVE METABOLIC PANEL - Abnormal; Notable for the following components:      Result Value   Glucose, Bld 119 (*)    BUN 21 (*)    Calcium 8.3 (*)    Albumin 3.1 (*)    AST 14 (*)    All other components within normal limits  CBC  I-STAT TROPONIN, ED  I-STAT BETA HCG BLOOD, ED ([facility], [facility], [facility] ONLY)    ED ECG REPORT   Date: 12/10/2018  Rate: 77  Rhythm: normal sinus rhythm  QRS Axis: normal  Intervals: normal  ST/T Wave abnormalities: normal  Conduction Disutrbances:none  Narrative Interpretation:   Old EKG Reviewed: unchanged from 2015  I have personally reviewed the EKG tracing and agree with the computerized printout as noted.  Radiology Dg Chest 2 View  Result Date: 12/10/2018 CLINICAL DATA:  Chest pain for 2 days EXAM: CHEST - 2 VIEW  COMPARISON:  04/09/2014 FINDINGS: Cardiac shadow is mildly enlarged. The lungs are well aerated bilaterally. No focal infiltrate or sizable effusion is seen. No acute bony abnormality is noted. IMPRESSION: No active cardiopulmonary disease. Electronically Signed   By: Inez Catalina M.D.   On: 12/10/2018 18:53    Procedures Procedures (including critical care time)  Medications Ordered in ED Medications  famotidine (PEPCID) tablet 20 mg (has no administration in time range)  alum & mag hydroxide-simeth (MAALOX/MYLANTA) 200-200-20 MG/5ML suspension 30 mL (30 mLs Oral Given 12/10/18 1812)     Initial Impression / Assessment and Plan / ED Course  I have reviewed the triage vital signs and the nursing notes.  Pertinent labs & imaging results that were available during my care of the patient were reviewed by me and considered in my medical decision making (see chart for details).        Patient seen and examined. Work-up initiated. EKG personally reviewed.   Vital signs reviewed and are as follows: BP (!) 164/94 (BP Location: Right Arm)   Pulse 72   Temp 98.4 F (36.9 C) (Oral)   Resp 15   Ht 5\' 2"  (1.575 m)   Wt 127 kg   SpO2 98%   BMI 51.21 kg/m   8:56 PM Delay in troponin as beta hcg was ordered in lieu of troponin.   This was redrawn and was 0.00.   Pt updated on results.  The GI cocktail made her feel better and her pain is currently 0.5 out of 10.  She is agreeable to discharged home at this time.  We reviewed all results and do not see any signs of any cardiac problems tonight.  Encouraged PCP follow-up this week to ensure resolution of symptoms.  8:57 PM Patient was counseled to return with severe chest pain, especially if the pain is crushing or pressure-like and spreads to the arms, back, neck, or jaw, or if they have sweating, nausea, or shortness of breath with the pain. They  were encouraged to call 911 with these symptoms.   The patient verbalized understanding and  agreed.    Final Clinical Impressions(s) / ED Diagnoses   Final diagnoses:  Precordial pain   Patient with atypical chest pain, normal EKG, negative troponin x1.  Symptoms started with the weekend.  And improved with Gas-X.  Improved further with GI cocktail here.  Work-up here is negative.  No PE/DVT risk factors and I have low suspicion for this.  Patient appears well with minimal discomfort at time of discharge.  She has appropriate PCP follow-up and is willing to follow-up.  Return structures as above.  ED Discharge Orders    None       Carlisle Cater, Hershal Coria 12/10/18 2058    Lennice Sites, DO 12/11/18 0045

## 2018-12-10 NOTE — ED Triage Notes (Addendum)
Patients arrives POV c/o gas pain onset of 2 days ago. Patient states pain started in her right shoulder blade and had moved around to mid chest. Took gas relief pills w/o relief. Patient states she called her PCP for stronger pills but requested patient be sent for evaluation. Patient denies any chest pressure/pain, no shortness of breathe. Patient speaking in full sentences.

## 2019-11-05 ENCOUNTER — Other Ambulatory Visit: Payer: Self-pay

## 2019-11-05 ENCOUNTER — Encounter (HOSPITAL_COMMUNITY): Payer: Self-pay | Admitting: Emergency Medicine

## 2019-11-05 ENCOUNTER — Emergency Department (HOSPITAL_COMMUNITY)
Admission: EM | Admit: 2019-11-05 | Discharge: 2019-11-05 | Disposition: A | Discharge status: Home / Self Care | Payer: Medicaid Other | Attending: Emergency Medicine | Admitting: Emergency Medicine

## 2019-11-05 DIAGNOSIS — Z96643 Presence of artificial hip joint, bilateral: Secondary | ICD-10-CM | POA: Insufficient documentation

## 2019-11-05 DIAGNOSIS — M25531 Pain in right wrist: Secondary | ICD-10-CM | POA: Insufficient documentation

## 2019-11-05 DIAGNOSIS — I10 Essential (primary) hypertension: Secondary | ICD-10-CM | POA: Diagnosis not present

## 2019-11-05 DIAGNOSIS — Z79899 Other long term (current) drug therapy: Secondary | ICD-10-CM | POA: Diagnosis not present

## 2019-11-05 DIAGNOSIS — F17210 Nicotine dependence, cigarettes, uncomplicated: Secondary | ICD-10-CM | POA: Diagnosis not present

## 2019-11-05 MED ORDER — MELOXICAM 7.5 MG PO TABS: 15.0000 mg | ORAL_TABLET | Freq: Every day | ORAL | 0 refills | Status: AC

## 2019-11-05 MED ORDER — KETOROLAC TROMETHAMINE 60 MG/2ML IM SOLN
60.0000 mg | Freq: Once | INTRAMUSCULAR | Status: AC
  Administered 2019-11-05: 60 mg via INTRAMUSCULAR
  Filled 2019-11-05: qty 2

## 2019-11-05 NOTE — ED Provider Notes (Signed)
[facility] EMERGENCY DEPARTMENT Provider Note   CSN: [id number] Arrival date & time: 11/05/19  [id number]     History Chief Complaint  Patient presents with  . Arm Pain    [name] is a 59 y.o. female.  The history is provided by the patient and medical records.    59 y.o. F with hx of anxiety, arthritis, depression, HTN, seasonal allergies, presenting to the ED for right wrist pain.  States this began last evening without injury/trauma/falls.  States pain localized to right volar wrist with radiation down into hand and into the middle finger.  Pain worse with movement.  Does endorse some tingling of the middle finger but denies focal numbness.  She is right hand dominant. 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  No intervention tried PTA.  Past Medical History:  Diagnosis Date  . Anxiety   . Arthritis   . Complication of anesthesia    ? anesth. hemmorhaged after c section  . Depression   . Hypertension   . Seasonal allergies     Patient Active Problem List   Diagnosis Date Noted  . Arthritis, hip 04/20/2014  . Arthritis of left hip 04/19/2014  . Osteoarthritis of right hip 08/09/2012  . Osteoarthritis of both knees 08/09/2012  . Anxiety and depression 08/09/2012  . ARTHRITIS, HIPS, BILATERAL 11/22/2010  . PAIN IN JOINT, MULTIPLE SITES 03/02/2010  . URI 08/27/2009  . SINUSITIS 07/01/2009  . LEG PAIN, RIGHT 06/24/2009  . TACHYCARDIA 06/24/2009  . ALLERGIC RHINITIS 01/20/2009  . HYPERCHOLESTEROLEMIA 10/05/2008  . CANDIDIASIS 10/01/2008  . FIBROIDS, UTERUS 10/01/2008  . MICROSCOPIC HEMATURIA 10/01/2008  . OBESITY 06/17/2008  . ANXIETY DISORDER 06/17/2008  . TOBACCO ABUSE 06/17/2008  . HYPERTENSION, BENIGN ESSENTIAL 09/25/2006    Past Surgical History:  Procedure Laterality Date  . CESAREAN SECTION    . COLONOSCOPY    . POLYPECTOMY    . TOTAL HIP ARTHROPLASTY Right 03/31/2013   Procedure: TOTAL HIP  ARTHROPLASTY;  Surgeon: Kerin Salen, MD;  Location: [place];  Service: Orthopedics;  Laterality: Right;  DEPUY/SROM RIGHT TOTAL HIP ARTHROPLASTY  . TOTAL HIP ARTHROPLASTY Left 04/20/2014   Procedure: TOTAL HIP ARTHROPLASTY;  Surgeon: Kerin Salen, MD;  Location: [place];  Service: Orthopedics;  Laterality: Left;     OB History   No obstetric history on file.     Family History  Problem Relation Age of Onset  . Dementia Mother   . Hypertension Mother   . Diabetes Mother   . Dementia Father   . Heart disease Father     Social History   Tobacco Use  . Smoking status: Current Every Day Smoker    Packs/day: 0.25    Years: 25.00    Pack years: 6.25    Types: Cigarettes  . Smokeless tobacco: Never Used  Substance Use Topics  . Alcohol use: Yes    Alcohol/week: 2.0 standard drinks    Types: 2 Standard drinks or equivalent per week    Comment: social  . Drug use: No    Home Medications Prior to Admission medications   Medication Sig Start Date End Date Taking? Authorizing Provider  aspirin EC 325 MG tablet Take 1 tablet (325 mg total) by mouth 2 (two) times daily. 04/20/14   Leighton Parody, PA-C  Aspirin-Salicylamide-Caffeine (BC HEADACHE POWDER PO) Take 1 packet by mouth daily as needed (for pain).    [provider]  cholecalciferol (VITAMIN D) 1000 UNITS tablet Take 1,000 Units by mouth every other day.    [provider]  escitalopram (LEXAPRO) 10 MG tablet Take 10 mg by mouth daily.      [provider]  ibuprofen (ADVIL,MOTRIN) 200 MG tablet Take 600 mg by mouth every 6 (six) hours as needed for fever, headache or mild pain.    [provider]  lisinopril (PRINIVIL,ZESTRIL) 10 MG tablet Take 10 mg by mouth daily.    [provider]  methocarbamol (ROBAXIN) 500 MG tablet Take 1 tablet (500 mg total) by mouth every 6 (six) hours as needed. 04/20/14   Leighton Parody, PA-C  Multiple Vitamin (THERA) TABS Take by mouth.    [provider]  naproxen (NAPROSYN) 500 MG tablet Take 1 tablet (500 mg total) by mouth 2 (two) times daily. 08/09/18   Wynona Luna, MD  potassium chloride (K-DUR) 10 MEQ tablet TAKE 1 TABLET(10 MEQ) BY MOUTH DAILY 01/24/18   [provider]  vitamin B-12 (CYANOCOBALAMIN) 1000 MCG tablet Take 1,000 mcg by mouth every other day.    [provider]    Allergies    Other  Review of Systems   Review of Systems  Musculoskeletal: Positive for arthralgias.  All other systems reviewed and are negative.   Physical Exam Updated Vital Signs BP (!) 152/104 (BP Location: Left Arm)   Pulse 70   Resp 18   SpO2 100%   Physical Exam Vitals and nursing note reviewed.  Constitutional:      Appearance: She is well-developed.  HENT:     Head: Normocephalic and atraumatic.  Eyes:     Conjunctiva/sclera: Conjunctivae normal.     Pupils: Pupils are equal, round, and reactive to light.  Cardiovascular:     Rate and Rhythm: Normal rate and regular rhythm.     Heart sounds: Normal heart sounds.  Pulmonary:     Effort: Pulmonary effort is normal.     Breath sounds: Normal breath sounds.  Abdominal:     General: Bowel sounds are normal.     Palpations: Abdomen is soft.  Musculoskeletal:        General: Normal range of motion.     Cervical back: Normal range of motion.     Comments: Right wrist grossly normal in appearance without visible swelling or deformity, + Tinel's sign, endorses tingling down into the distal middle digit but no focal numbness, no wrist drop, normal sensation and cap refill distally  Skin:    General: Skin is warm and dry.  Neurological:     Mental Status: She is alert and oriented to person, place, and time.     ED Results / Procedures / Treatments   Labs (all labs ordered are listed, but only abnormal results are displayed) Labs Reviewed - No data to display  EKG None  Radiology No results found.  Procedures Procedures (including  critical care time)  Medications Ordered in ED Medications - No data to display  ED Course  I have reviewed the triage vital signs and the nursing notes.  Pertinent labs & imaging results that were available during my care of the patient were reviewed by me and considered in my medical decision making (see chart for details).    MDM Rules/Calculators/A&P  59 year old female presenting to the ED with right wrist pain that began last evening.  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  Pain along  the volar right wrist.  On exam there is no swelling, deformity, or overlying skin changes.  She has a positive Tinel's sign and endorses paresthesias into the right middle finger.  Hand is NVI, no wrist drop.  Symptoms are concerning for carpal tunnel.  Will place in wrist splint and start anti-inflammatories.  Will have her follow-up with hand surgery.  Return here for any new/acute changes.  Final Clinical Impression(s) / ED Diagnoses Final diagnoses:  Right wrist pain    Rx / DC Orders ED Discharge Orders         Ordered    meloxicam (MOBIC) 7.5 MG tablet  Daily     11/05/19 0613           Larene Pickett, PA-C 11/05/19 [place], [place], DO 11/05/19 [phone]

## 2019-11-05 NOTE — Discharge Instructions (Addendum)
I would wear the wrist brace, especially while working.  Take the anti-inflammatories. Recommend that you follow up with the hand specialist, Dr. Lenon Curt-- call his office this morning for appt. Take the meloxicam as prescribed.  Can take tylenol with this if you like. Return here for any new/acute changes.

## 2019-11-05 NOTE — ED Notes (Signed)
Patient verbalizes understanding of discharge instructions. Opportunity for questioning and answers were provided. Armband removed by staff, pt discharged from ED ambulatory.   

## 2019-11-05 NOTE — ED Triage Notes (Signed)
Patient reports right middle finger pain / right hand pain and right forearm pain onset last night , denies injury , no deformity or swelling .

## 2019-12-22 ENCOUNTER — Ambulatory Visit: Payer: Medicaid Other | Attending: Internal Medicine

## 2019-12-22 DIAGNOSIS — Z23 Encounter for immunization: Secondary | ICD-10-CM

## 2019-12-22 NOTE — Progress Notes (Signed)
   Covid-19 Vaccination Clinic  Name:  [name]    MRN: [id number] DOB: 10-04-1960  12/22/2019  [name] was observed post Covid-19 immunization for 15 minutes without incident. She was provided with Vaccine Information Sheet and instruction to access the V-Safe system.   [name] was instructed to call 911 with any severe reactions post vaccine: Marland Kitchen Difficulty breathing  . Swelling of face and throat  . A fast heartbeat  . A bad rash all over body  . Dizziness and weakness   Immunizations Administered    Name Date Dose VIS Date Route   Pfizer COVID-19 Vaccine 12/22/2019  4:40 PM 0.3 mL 09/05/2019 Intramuscular   Manufacturer: [place]   Lot: [id number]   [place]: [id number]

## 2020-01-14 ENCOUNTER — Ambulatory Visit: Payer: Medicaid Other | Attending: Internal Medicine

## 2020-01-14 DIAGNOSIS — Z23 Encounter for immunization: Secondary | ICD-10-CM

## 2020-01-14 NOTE — Progress Notes (Signed)
   Covid-19 Vaccination Clinic  Name:  [name]    MRN: [id number] DOB: 26-Aug-1961  01/14/2020  [name] was observed post Covid-19 immunization for 15 minutes without incident. She was provided with Vaccine Information Sheet and instruction to access the V-Safe system.   [name] was instructed to call 911 with any severe reactions post vaccine: Marland Kitchen Difficulty breathing  . Swelling of face and throat  . A fast heartbeat  . A bad rash all over body  . Dizziness and weakness   Immunizations Administered    Name Date Dose VIS Date Route   Pfizer COVID-19 Vaccine 01/14/2020  2:55 PM 0.3 mL 11/19/2018 Intramuscular   Manufacturer: [place]   Lot: [id number]   [place]: [id number]

## 2020-05-06 IMAGING — CR CHEST - 2 VIEW
2 series · 2 of 2 positions shown · non-contrast
Comparison: 04/09/2014

CLINICAL DATA: Chest pain for 2 days

EXAM:
CHEST - 2 VIEW

[chest pa]
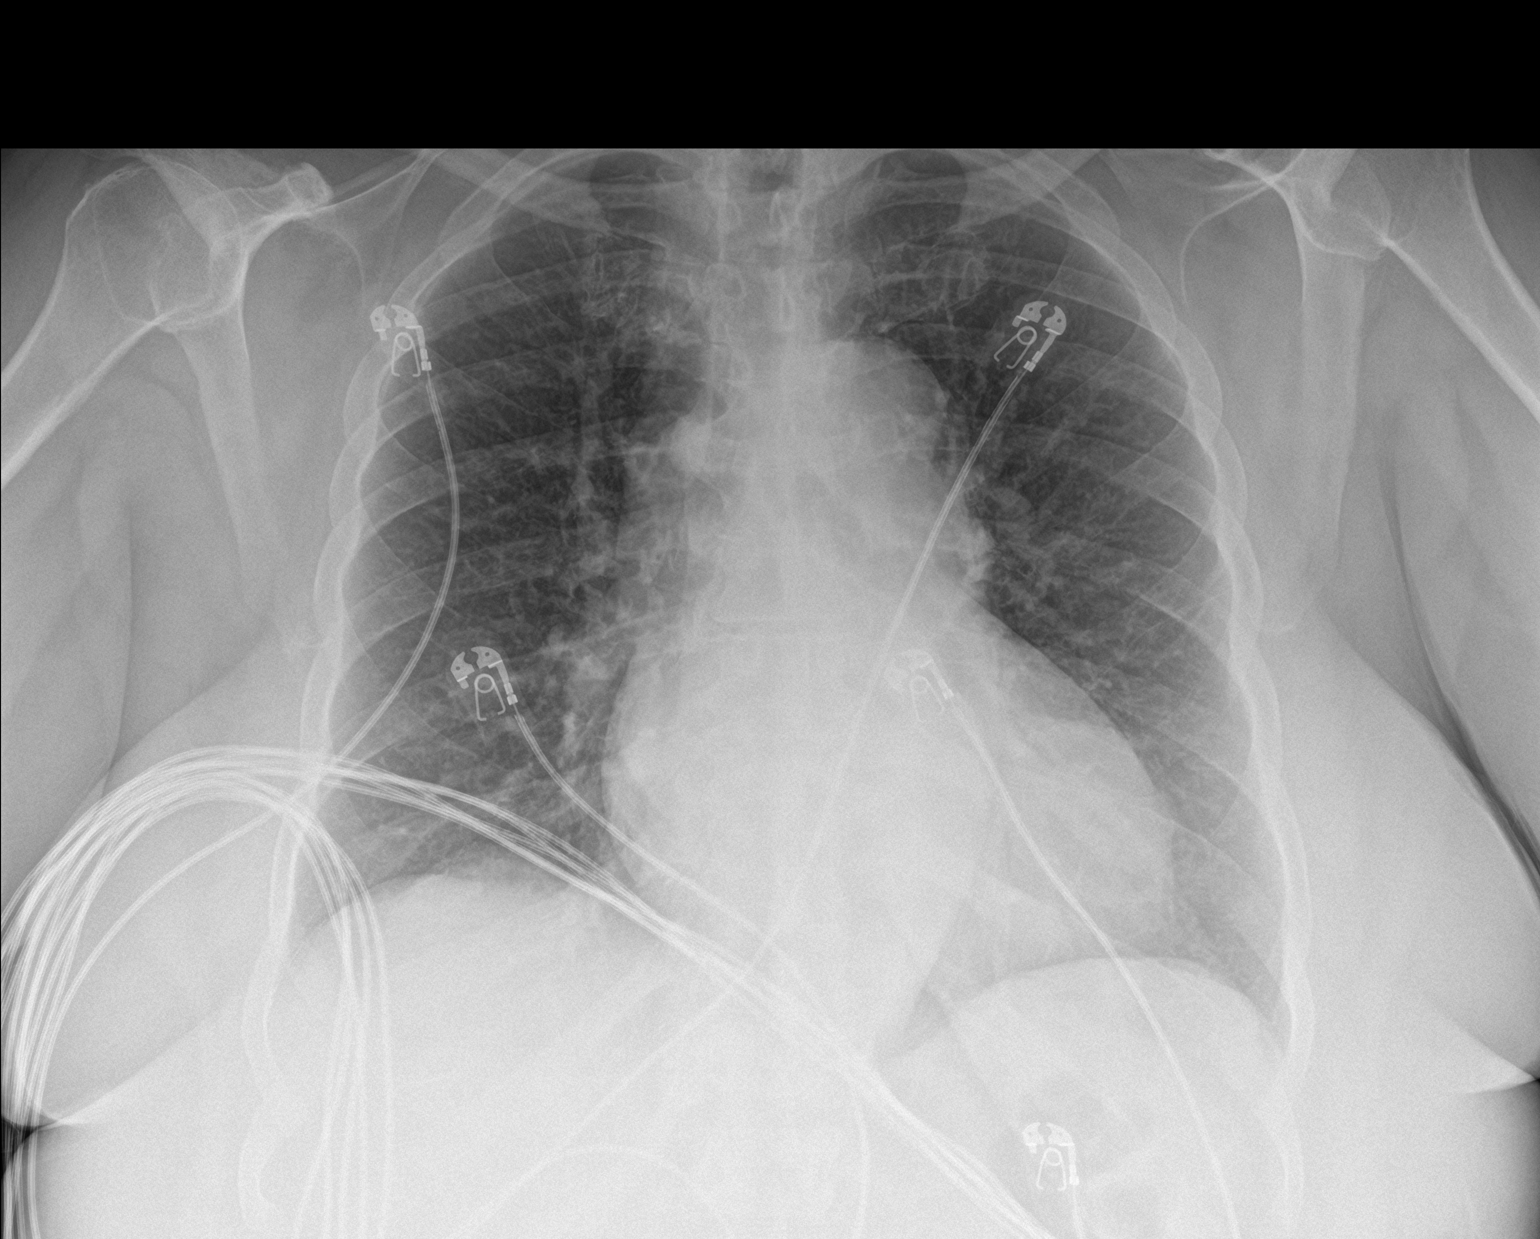

[chest lat]
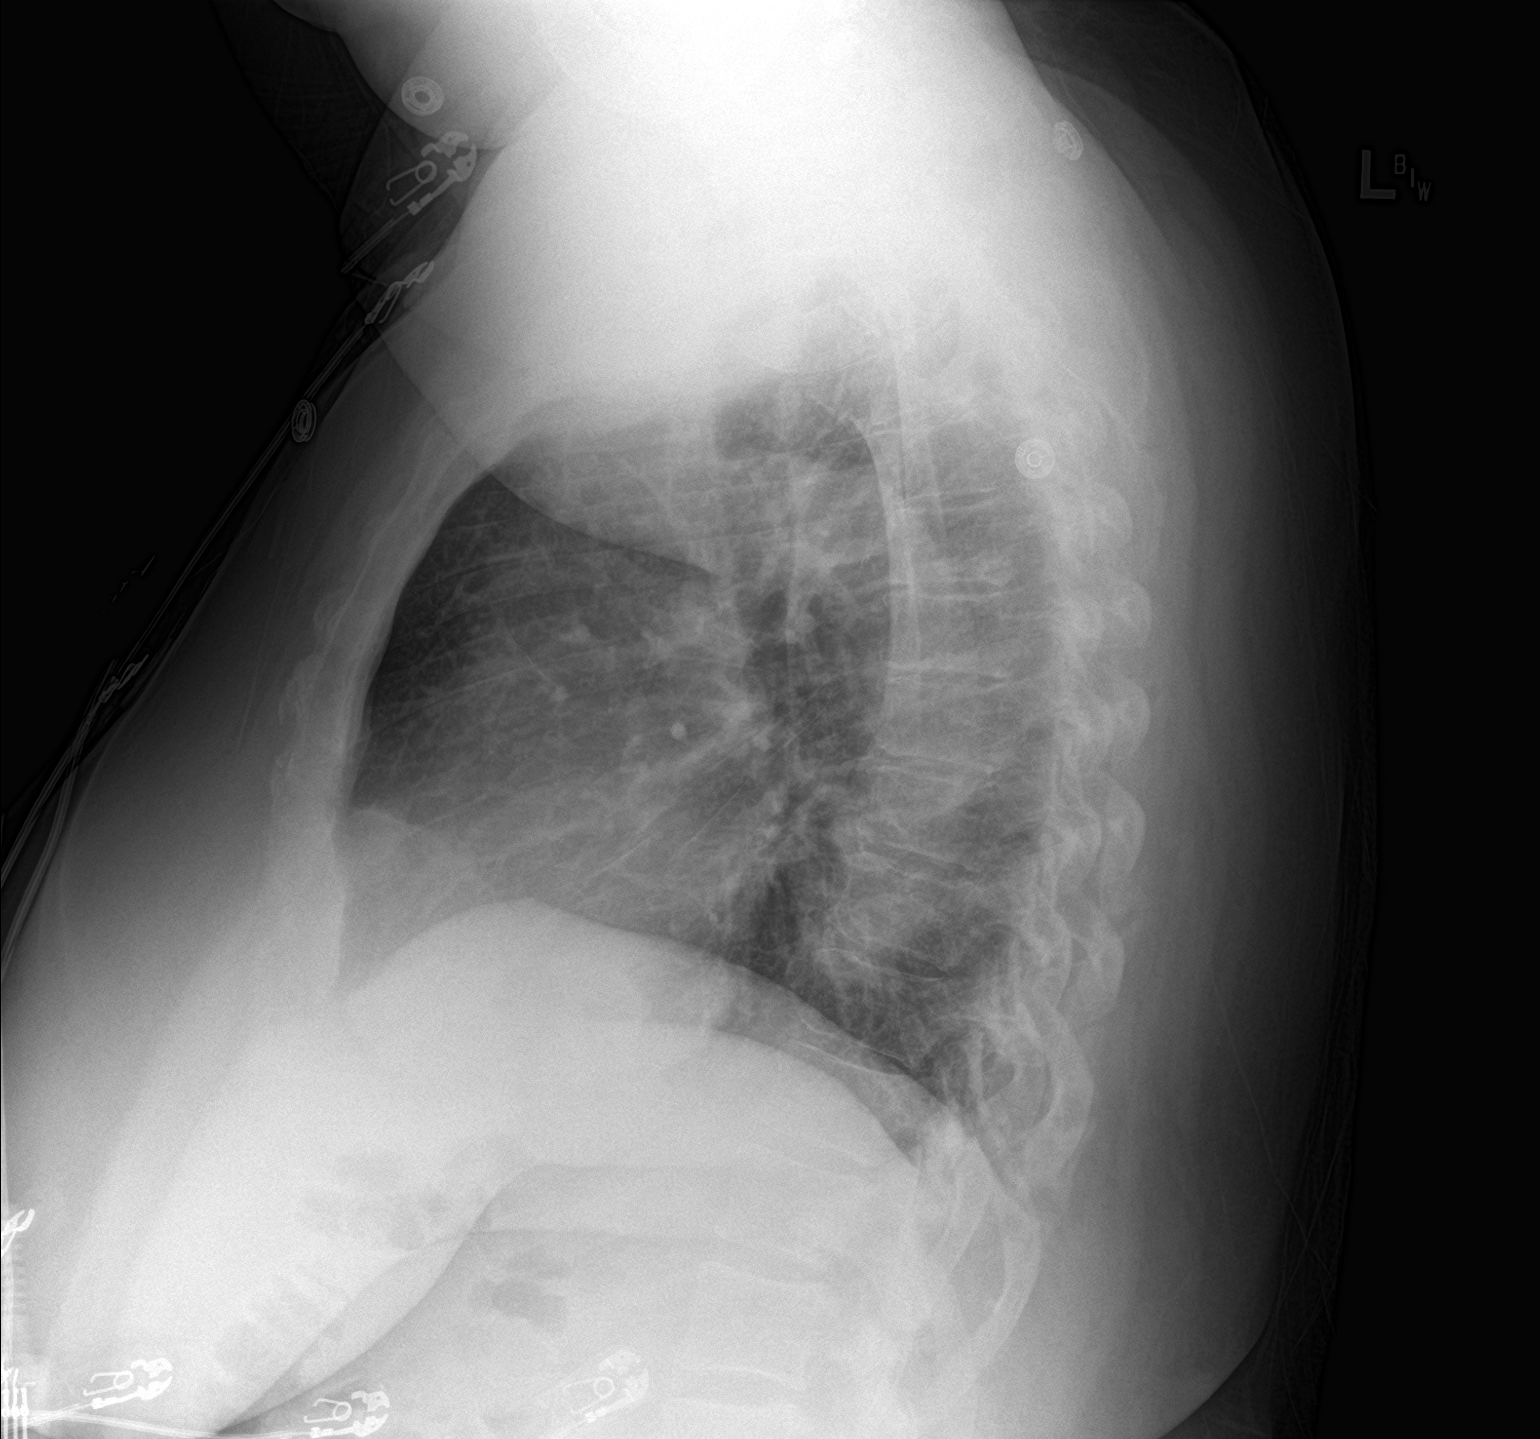

[2 of 2 positions shown; findings below may reference images not displayed]

FINDINGS: Cardiac shadow is mildly enlarged. The lungs are well aerated
bilaterally. No focal infiltrate or sizable effusion is seen. No
acute bony abnormality is noted.
IMPRESSION: No active cardiopulmonary disease.

## 2021-03-10 NOTE — Progress Notes (Deleted)
Office Visit Note  Patient: [name]             Date of Birth: 27-Feb-1961           MRN: [id number]             PCP: Chesley Noon, MD Referring: Arvella Nigh, MD Visit Date: 03/24/2021 Occupation: @GUAROCC @  Subjective:  No chief complaint on file.   History of Present Illness: [name] is a 60 y.o. female ***   Activities of Daily Living:  Patient reports morning stiffness for *** {minute/hour:19697}.   Patient {ACTIONS;DENIES/REPORTS:21021675::"Denies"} nocturnal pain.  Difficulty dressing/grooming: {ACTIONS;DENIES/REPORTS:21021675::"Denies"} Difficulty climbing stairs: {ACTIONS;DENIES/REPORTS:21021675::"Denies"} Difficulty getting out of chair: {ACTIONS;DENIES/REPORTS:21021675::"Denies"} Difficulty using hands for taps, buttons, cutlery, and/or writing: {ACTIONS;DENIES/REPORTS:21021675::"Denies"}  No Rheumatology ROS completed.   PMFS History:  Patient Active Problem List   Diagnosis Date Noted   Arthritis, hip 04/20/2014   Arthritis of left hip 04/19/2014   Osteoarthritis of right hip 08/09/2012   Osteoarthritis of both knees 08/09/2012   Anxiety and depression 08/09/2012   ARTHRITIS, HIPS, BILATERAL 11/22/2010   PAIN IN JOINT, MULTIPLE SITES 03/02/2010   URI 08/27/2009   SINUSITIS 07/01/2009   LEG PAIN, RIGHT 06/24/2009   TACHYCARDIA 06/24/2009   ALLERGIC RHINITIS 01/20/2009   HYPERCHOLESTEROLEMIA 10/05/2008   CANDIDIASIS 10/01/2008   FIBROIDS, UTERUS 10/01/2008   MICROSCOPIC HEMATURIA 10/01/2008   OBESITY 06/17/2008   ANXIETY DISORDER 06/17/2008   TOBACCO ABUSE 06/17/2008   HYPERTENSION, BENIGN ESSENTIAL 09/25/2006    Past Medical History:  Diagnosis Date   Anxiety    Arthritis    Complication of anesthesia    ? anesth. hemmorhaged after c section   Depression    Hypertension    Seasonal allergies     Family History  Problem Relation Age of Onset   Dementia Mother    Hypertension Mother    Diabetes Mother    Dementia  Father    Heart disease Father    Past Surgical History:  Procedure Laterality Date   CESAREAN SECTION     COLONOSCOPY     POLYPECTOMY     TOTAL HIP ARTHROPLASTY Right 03/31/2013   Procedure: TOTAL HIP ARTHROPLASTY;  Surgeon: Kerin Salen, MD;  Location: [place];  Service: Orthopedics;  Laterality: Right;  DEPUY/SROM RIGHT TOTAL HIP ARTHROPLASTY   TOTAL HIP ARTHROPLASTY Left 04/20/2014   Procedure: TOTAL HIP ARTHROPLASTY;  Surgeon: Kerin Salen, MD;  Location: [place];  Service: Orthopedics;  Laterality: Left;   Social History   Social History Narrative   Not on file   Immunization History  Administered Date(s) Administered   Influenza Whole 10/01/2008, 07/15/2009   PFIZER(Purple Top)SARS-COV-2 Vaccination 12/22/2019, 01/14/2020   Pneumococcal Polysaccharide-23 04/03/2013   Td 09/26/2003     Objective: Vital Signs: There were no vitals taken for this visit.   Physical Exam   Musculoskeletal Exam:   CDAI Exam: CDAI Score: -- Patient Global: --; Provider Global: -- Swollen: --; Tender: -- Joint Exam 03/24/2021   No joint exam has been documented for this visit   There is currently no information documented on the homunculus. Go to the Rheumatology activity and complete the homunculus joint exam.  Investigation: No additional findings.  Imaging: No results found.  Recent Labs: Lab Results  Component Value Date   WBC 8.9 12/10/2018   HGB 12.5 12/10/2018   PLT 178 12/10/2018   NA 139 12/10/2018   K 3.8 12/10/2018   CL 105 12/10/2018   CO2 26  12/10/2018   GLUCOSE 119 (H) 12/10/2018   BUN 21 (H) 12/10/2018   CREATININE 0.98 12/10/2018   BILITOT 0.3 12/10/2018   ALKPHOS 73 12/10/2018   AST 14 (L) 12/10/2018   ALT 13 12/10/2018   PROT 6.7 12/10/2018   ALBUMIN 3.1 (L) 12/10/2018   CALCIUM 8.3 (L) 12/10/2018   GFRAA >60 12/10/2018    Speciality Comments: No specialty comments available.  Procedures:  No procedures performed Allergies: Other   Assessment /  Plan:     Visit Diagnoses: Polyarthralgia - No autoimmune labs  Primary osteoarthritis of both hips  Primary osteoarthritis of both knees  Essential hypertension  Hypercholesterolemia  Orders: No orders of the defined types were placed in this encounter.  No orders of the defined types were placed in this encounter.   Face-to-face time spent with patient was *** minutes. Greater than 50% of time was spent in counseling and coordination of care.  Follow-Up Instructions: No follow-ups on file.   Ofilia Neas, PA-C  Note - This record has been created using Dragon software.  Chart creation errors have been sought, but may not always  have been located. Such creation errors do not reflect on  the standard of medical care.

## 2021-03-24 ENCOUNTER — Ambulatory Visit: Payer: Medicaid Other | Admitting: Rheumatology

## 2021-04-18 ENCOUNTER — Ambulatory Visit: Payer: Medicaid Other | Admitting: Rheumatology

## 2021-11-02 ENCOUNTER — Emergency Department (HOSPITAL_BASED_OUTPATIENT_CLINIC_OR_DEPARTMENT_OTHER)
Admission: EM | Admit: 2021-11-02 | Discharge: 2021-11-02 | Disposition: A | Discharge status: Home / Self Care | Payer: Medicaid Other | Attending: Emergency Medicine | Admitting: Emergency Medicine

## 2021-11-02 ENCOUNTER — Emergency Department (HOSPITAL_BASED_OUTPATIENT_CLINIC_OR_DEPARTMENT_OTHER): Payer: Medicaid Other

## 2021-11-02 ENCOUNTER — Other Ambulatory Visit: Payer: Self-pay

## 2021-11-02 ENCOUNTER — Encounter (HOSPITAL_BASED_OUTPATIENT_CLINIC_OR_DEPARTMENT_OTHER): Payer: Self-pay | Admitting: Emergency Medicine

## 2021-11-02 DIAGNOSIS — R52 Pain, unspecified: Secondary | ICD-10-CM

## 2021-11-02 DIAGNOSIS — M25561 Pain in right knee: Secondary | ICD-10-CM | POA: Insufficient documentation

## 2021-11-02 DIAGNOSIS — Z96649 Presence of unspecified artificial hip joint: Secondary | ICD-10-CM | POA: Diagnosis not present

## 2021-11-02 DIAGNOSIS — Z7982 Long term (current) use of aspirin: Secondary | ICD-10-CM | POA: Insufficient documentation

## 2021-11-02 MED ORDER — PREDNISONE 10 MG PO TABS: 20.0000 mg | ORAL_TABLET | Freq: Every day | ORAL | 0 refills | Status: AC

## 2021-11-02 MED ORDER — DEXAMETHASONE SODIUM PHOSPHATE 10 MG/ML IJ SOLN
10.0000 mg | Freq: Once | INTRAMUSCULAR | Status: AC
  Administered 2021-11-02: 10 mg via INTRAMUSCULAR
  Filled 2021-11-02: qty 1

## 2021-11-02 NOTE — ED Triage Notes (Signed)
Pt presents for R knee pain for 3 days that feels like her RA. Denies fall or new injury. Endorses pain, swelling, decreased mobility, heat in joint. Tried methotrexate.

## 2021-11-02 NOTE — Discharge Instructions (Signed)
Your right knee pain is likely due to a rheumatoid arthritis flare.  Please continue taking methotrexate and also take prednisone as prescribed.  Call and follow-up closely with your rheumatologist for further care.

## 2021-11-02 NOTE — ED Provider Notes (Signed)
[place] EMERGENCY DEPT Provider Note   CSN: [id number] Arrival date & time: 11/02/21  1016     History  Chief Complaint  Patient presents with   Knee Pain    [name] is a 61 y.o. female.  The history is provided by the patient. No language interpreter was used.  Knee Pain Associated symptoms: no fever    61 year old obese female with significant history of rheumatoid arthritis on methotrexate, prior total hip replacement who presents for evaluation of right knee pain.  Patient endorsed gradual onset of right knee pain that started since yesterday.  Pain is sharp throbbing nonradiating worse with movement but present at rest.  Pain is not associate with fever chills hip pain ankle pain numbness weakness or calf tenderness.  No recent injury.  She reports pain felt very similar to prior RA flare that she had had in the past usually improved with steroid.  No history of diabetes.  No history of DVT.  No fever or chills.  She mentioned having difficulty ambulating due to her pain but wants her knee to get better enough so she can go to work tomorrow.  She does have a rheumatologist.  She is currently on methotrexate.  Home Medications Prior to Admission medications   Medication Sig Start Date End Date Taking? Authorizing Provider  aspirin EC 325 MG tablet Take 1 tablet (325 mg total) by mouth 2 (two) times daily. 04/20/14   Leighton Parody, PA-C  Aspirin-Salicylamide-Caffeine (BC HEADACHE POWDER PO) Take 1 packet by mouth daily as needed (for pain).    [provider]  cholecalciferol (VITAMIN D) 1000 UNITS tablet Take 1,000 Units by mouth every other day.    [provider]  escitalopram (LEXAPRO) 10 MG tablet Take 10 mg by mouth daily.      [provider]  ibuprofen (ADVIL,MOTRIN) 200 MG tablet Take 600 mg by mouth every 6 (six) hours as needed for fever, headache or mild pain.    [provider]  lisinopril  (PRINIVIL,ZESTRIL) 10 MG tablet Take 10 mg by mouth daily.    [provider]  meloxicam (MOBIC) 7.5 MG tablet Take 2 tablets (15 mg total) by mouth daily. 11/05/19   Larene Pickett, PA-C  methocarbamol (ROBAXIN) 500 MG tablet Take 1 tablet (500 mg total) by mouth every 6 (six) hours as needed. 04/20/14   Leighton Parody, PA-C  Multiple Vitamin (THERA) TABS Take by mouth.    [provider]  naproxen (NAPROSYN) 500 MG tablet Take 1 tablet (500 mg total) by mouth 2 (two) times daily. 08/09/18   Wynona Luna, MD  potassium chloride (K-DUR) 10 MEQ tablet TAKE 1 TABLET(10 MEQ) BY MOUTH DAILY 01/24/18   [provider]  vitamin B-12 (CYANOCOBALAMIN) 1000 MCG tablet Take 1,000 mcg by mouth every other day.    [provider]      Allergies    Other    Review of Systems   Review of Systems  Constitutional:  Negative for fever.  Musculoskeletal:  Positive for arthralgias.   Physical Exam Updated Vital Signs BP 140/89 (BP Location: Left Arm)    Pulse 76    Temp 98.4 F (36.9 C)    Resp 18    Wt 127 kg    SpO2 97%    BMI 51.21 kg/m  Physical Exam Vitals and nursing note reviewed.  Constitutional:      General: She is not in acute distress.  Appearance: She is well-developed. She is obese.  HENT:     Head: Atraumatic.  Eyes:     Conjunctiva/sclera: Conjunctivae normal.  Pulmonary:     Effort: Pulmonary effort is normal.  Musculoskeletal:        General: Tenderness (Right knee: Tenderness about the knee with increased pain from flexion and extension but no deformity no laxity no erythema edema or warmth noted.  Patella is located) present.     Cervical back: Neck supple.     Comments: Right hip and right ankle nontender.  Intact dorsalis pedis pulse  Skin:    Findings: No rash.  Neurological:     Mental Status: She is alert.  Psychiatric:        Mood and Affect: Mood normal.    ED Results / Procedures / Treatments   Labs (all labs ordered  are listed, but only abnormal results are displayed) Labs Reviewed - No data to display  EKG None  Radiology DG Knee Right Port  Result Date: 11/02/2021 CLINICAL DATA:  Right knee pain.  No known injury. EXAM: PORTABLE RIGHT KNEE - 1-2 VIEW COMPARISON:  None. FINDINGS: No evidence of fracture or dislocation. Small knee joint effusion. Mild medial compartment joint space narrowing. Small bidirectional patellar enthesophytes. Diffuse soft tissue prominence. IMPRESSION: Mild degenerative changes of the right knee with small nonspecific joint effusion. Electronically Signed   By: Davina Poke D.O.   On: 11/02/2021 11:11    Procedures Procedures    Medications Ordered in ED Medications  dexamethasone (DECADRON) injection 10 mg (10 mg Intramuscular Given 11/02/21 1249)    ED Course/ Medical Decision Making/ A&P                           Medical Decision Making Problems Addressed: Anterior knee pain, right: acute illness or injury  Amount and/or Complexity of Data Reviewed External Data Reviewed: notes. Radiology: ordered and independent interpretation performed.  Risk Prescription drug management.   BP 140/89 (BP Location: Left Arm)    Pulse 76    Temp 98.4 F (36.9 C)    Resp 18    Wt 127 kg    SpO2 97%    BMI 51.21 kg/m   12:46 PM This is a 61 year old obese female with history of rheumatoid arthritis currently on methotrexate who presents with atraumatic right knee pain ongoing for the past 2 days.  She endorsed pain that felt similar to prior RA flare.  On exam she has tenderness about the knee without any signs of infection or signs of dislocation or joint laxity.  An x-ray obtained of the right knee shows mild degenerative change with small nonspecific joint effusion.  X-ray was independently visualized and interpreted by me.  I have reviewed prior outside notes including notes from rheumatology in September and November 2022 and included in my decision making.  Patient  does have document history of rheumatoid arthritis involving multiple sites with positive rheumatoid factor.  Differential diagnosis include RA flare, knee sprain, strains, DVT, infectious etiology such as septic joint, or patellar dislocation.  However, I have high suspicion for RA flare.  Patient does not have a history of diabetes and it appears steroid helps in the past therefore I will provide prednisone and outpatient follow-up.  Patient voiced understanding and agrees with the plan.        Final Clinical Impression(s) / ED Diagnoses Final diagnoses:  Pain  Anterior knee pain, right  Rx / DC Orders ED Discharge Orders          Ordered    predniSONE (DELTASONE) 10 MG tablet  Daily        11/02/21 1252              Domenic Moras, PA-C 11/02/21 1254    Horton, Alvin Critchley, DO 11/03/21 [phone]

## 2022-04-14 ENCOUNTER — Emergency Department (HOSPITAL_BASED_OUTPATIENT_CLINIC_OR_DEPARTMENT_OTHER)
Admission: EM | Admit: 2022-04-14 | Discharge: 2022-04-14 | Disposition: A | Discharge status: Home / Self Care | Payer: Medicaid Other | Attending: Emergency Medicine | Admitting: Emergency Medicine

## 2022-04-14 ENCOUNTER — Encounter (HOSPITAL_BASED_OUTPATIENT_CLINIC_OR_DEPARTMENT_OTHER): Payer: Self-pay | Admitting: Emergency Medicine

## 2022-04-14 ENCOUNTER — Other Ambulatory Visit: Payer: Self-pay

## 2022-04-14 DIAGNOSIS — Z23 Encounter for immunization: Secondary | ICD-10-CM | POA: Diagnosis not present

## 2022-04-14 DIAGNOSIS — X18XXXA Contact with other hot metals, initial encounter: Secondary | ICD-10-CM | POA: Diagnosis not present

## 2022-04-14 DIAGNOSIS — T22032A Burn of unspecified degree of left upper arm, initial encounter: Secondary | ICD-10-CM | POA: Diagnosis present

## 2022-04-14 DIAGNOSIS — T22232A Burn of second degree of left upper arm, initial encounter: Secondary | ICD-10-CM | POA: Diagnosis not present

## 2022-04-14 DIAGNOSIS — T2220XA Burn of second degree of shoulder and upper limb, except wrist and hand, unspecified site, initial encounter: Secondary | ICD-10-CM

## 2022-04-14 DIAGNOSIS — Z7982 Long term (current) use of aspirin: Secondary | ICD-10-CM | POA: Diagnosis not present

## 2022-04-14 DIAGNOSIS — R509 Fever, unspecified: Secondary | ICD-10-CM | POA: Insufficient documentation

## 2022-04-14 DIAGNOSIS — T310 Burns involving less than 10% of body surface: Secondary | ICD-10-CM | POA: Diagnosis not present

## 2022-04-14 MED ORDER — TETANUS-DIPHTH-ACELL PERTUSSIS 5-2.5-18.5 LF-MCG/0.5 IM SUSY
0.5000 mL | PREFILLED_SYRINGE | Freq: Once | INTRAMUSCULAR | Status: AC
  Administered 2022-04-14: 0.5 mL via INTRAMUSCULAR
  Filled 2022-04-14: qty 0.5

## 2022-04-14 MED ORDER — TRIPLE ANTIBIOTIC 5-400-5000 EX OINT: TOPICAL_OINTMENT | Freq: Four times a day (QID) | CUTANEOUS | 0 refills | Status: AC

## 2022-04-14 NOTE — ED Triage Notes (Signed)
Pt arrives to ED with c/o freezer burn to left shoulder after using an ice pack.

## 2022-04-14 NOTE — ED Provider Notes (Signed)
[facility]ERGENC[facility] DEPT Provider Note   CSN: [id number] Arrival date & time: 04/14/22  1645     History  Chief Complaint  Patient presents with   Burn    [name] is a 61 y.o. female.  Pt reports she left an ice pack on her left arm. Pt reports she has rheumatoid arthritis and uses ice to help with pain.    The history is provided by the patient. No language interpreter was used.  Burn Burn location:  Shoulder/arm Shoulder/arm burn location:  L shoulder Burn quality:  Red and intact blister Time since incident:  1 day Progression:  Worsening Mechanism of burn: ice. Relieved by:  Nothing Worsened by:  Nothing Ineffective treatments:  None tried Tetanus status:  Out of date      Home Medications Prior to Admission medications   Medication Sig Start Date End Date Taking? Authorizing Provider  aspirin EC 325 MG tablet Take 1 tablet (325 mg total) by mouth 2 (two) times daily. 04/20/14   Leighton Parody, PA-C  Aspirin-Salicylamide-Caffeine (BC HEADACHE POWDER PO) Take 1 packet by mouth daily as needed (for pain).    [provider]  cholecalciferol (VITAMIN D) 1000 UNITS tablet Take 1,000 Units by mouth every other day.    [provider]  escitalopram (LEXAPRO) 10 MG tablet Take 10 mg by mouth daily.      [provider]  ibuprofen (ADVIL,MOTRIN) 200 MG tablet Take 600 mg by mouth every 6 (six) hours as needed for fever, headache or mild pain.    [provider]  lisinopril (PRINIVIL,ZESTRIL) 10 MG tablet Take 10 mg by mouth daily.    [provider]  meloxicam (MOBIC) 7.5 MG tablet Take 2 tablets (15 mg total) by mouth daily. 11/05/19   Larene Pickett, PA-C  methocarbamol (ROBAXIN) 500 MG tablet Take 1 tablet (500 mg total) by mouth every 6 (six) hours as needed. 04/20/14   Leighton Parody, PA-C  Multiple Vitamin (THERA) TABS Take by mouth.    [provider]  naproxen (NAPROSYN) 500 MG tablet  Take 1 tablet (500 mg total) by mouth 2 (two) times daily. 08/09/18   Wynona Luna, MD  potassium chloride (K-DUR) 10 MEQ tablet TAKE 1 TABLET(10 MEQ) BY MOUTH DAILY 01/24/18   [provider]  predniSONE (DELTASONE) 10 MG tablet Take 2 tablets (20 mg total) by mouth daily. 11/02/21   Domenic Moras, PA-C  vitamin B-12 (CYANOCOBALAMIN) 1000 MCG tablet Take 1,000 mcg by mouth every other day.    [provider]      Allergies    Lisinopril and Other    Review of Systems   Review of Systems  All other systems reviewed and are negative.   Physical Exam Updated Vital Signs BP (!) 173/107 (BP Location: Right Arm)   Pulse 70   Temp 98.5 F (36.9 C) (Oral)   Resp 18   Ht '5\' 2"'$  (1.575 m)   Wt 127 kg   SpO2 99%   BMI 51.21 kg/m  Physical Exam Vitals and nursing note reviewed.  Constitutional:      General: She is not in acute distress.    Appearance: She is well-developed.  HENT:     Head: Normocephalic and atraumatic.  Eyes:     Conjunctiva/sclera: Conjunctivae normal.  Cardiovascular:     Rate and Rhythm: Normal rate.  Pulmonary:     Effort: Pulmonary effort is normal. No respiratory distress.  Musculoskeletal:  General: No swelling.     Cervical back: Neck supple.  Skin:    General: Skin is warm.     Capillary Refill: Capillary refill takes less than 2 seconds.     Comments: Discolored blisters left upper arm.  12x12 cm area   Neurological:     Mental Status: She is alert.  Psychiatric:        Mood and Affect: Mood normal.     ED Results / Procedures / Treatments   Labs (all labs ordered are listed, but only abnormal results are displayed) Labs Reviewed - No data to display  EKG None  Radiology No results found.  Procedures Procedures    Medications Ordered in ED Medications  Tdap (BOOSTRIX) injection 0.5 mL (has no administration in time range)    ED Course/ Medical Decision Making/ A&P                           Medical  Decision Making Pt reports she left ice on her arm for to long.   Amount and/or Complexity of Data Reviewed Independent Historian:     Details: Pt here with family External Data Reviewed: notes.    Details: rheumatology notes reviewed  Risk Prescription drug management. Risk Details: Pt counseled on wound care for burns.  Pt advised topical antibiotic.  Pt ask for that to be prescribed            Final Clinical Impression(s) / ED Diagnoses Final diagnoses:  Burn of left arm, second degree, initial encounter    Rx / DC Orders ED Discharge Orders          Ordered    neomycin-bacitracin-polymyxin (NEOSPORIN) [phone] ointment  4 times daily        04/14/22 1851          An After Visit Summary was printed and given to the patient.     Sidney Ace 04/14/22 1852    Tegeler, Gwenyth Allegra, MD 04/15/22 0010

## 2022-04-14 NOTE — ED Notes (Signed)
Wound care, ointment, and non-stick bandage has been applied.

## 2022-05-24 ENCOUNTER — Other Ambulatory Visit: Payer: Self-pay | Admitting: Obstetrics and Gynecology

## 2022-05-24 DIAGNOSIS — Z720 Tobacco use: Secondary | ICD-10-CM

## 2022-06-14 ENCOUNTER — Ambulatory Visit
Admission: RE | Admit: 2022-06-14 | Discharge: 2022-06-14 | Disposition: A | Discharge status: Home / Self Care | Payer: Medicaid Other | Source: Ambulatory Visit | Attending: Obstetrics and Gynecology | Admitting: Obstetrics and Gynecology

## 2022-06-14 DIAGNOSIS — Z720 Tobacco use: Secondary | ICD-10-CM

## 2022-09-10 ENCOUNTER — Other Ambulatory Visit: Payer: Self-pay

## 2022-09-10 ENCOUNTER — Emergency Department (HOSPITAL_BASED_OUTPATIENT_CLINIC_OR_DEPARTMENT_OTHER): Payer: Medicaid Other | Admitting: Radiology

## 2022-09-10 ENCOUNTER — Encounter (HOSPITAL_BASED_OUTPATIENT_CLINIC_OR_DEPARTMENT_OTHER): Payer: Self-pay | Admitting: Emergency Medicine

## 2022-09-10 ENCOUNTER — Emergency Department (HOSPITAL_BASED_OUTPATIENT_CLINIC_OR_DEPARTMENT_OTHER)
Admission: EM | Admit: 2022-09-10 | Discharge: 2022-09-10 | Disposition: A | Discharge status: Home / Self Care | Payer: Medicaid Other | Attending: Emergency Medicine | Admitting: Emergency Medicine

## 2022-09-10 DIAGNOSIS — W07XXXA Fall from chair, initial encounter: Secondary | ICD-10-CM | POA: Insufficient documentation

## 2022-09-10 DIAGNOSIS — S39012A Strain of muscle, fascia and tendon of lower back, initial encounter: Secondary | ICD-10-CM | POA: Diagnosis not present

## 2022-09-10 DIAGNOSIS — T148XXA Other injury of unspecified body region, initial encounter: Secondary | ICD-10-CM

## 2022-09-10 DIAGNOSIS — Z7982 Long term (current) use of aspirin: Secondary | ICD-10-CM | POA: Insufficient documentation

## 2022-09-10 DIAGNOSIS — M549 Dorsalgia, unspecified: Secondary | ICD-10-CM | POA: Diagnosis present

## 2022-09-10 MED ORDER — CYCLOBENZAPRINE HCL 10 MG PO TABS: 10.0000 mg | ORAL_TABLET | Freq: Two times a day (BID) | ORAL | 0 refills | Status: AC | PRN

## 2022-09-10 NOTE — ED Provider Notes (Signed)
[facility] DEPT Provider Note   CSN: [id number] Arrival date & time: 09/10/22  7371     History  Chief Complaint  Patient presents with   Luanne Bras QUADASIA NEWSHAM is a 61 y.o. female.  Patient is a 61 year old female who presents with pain in her right upper back.  She was sliding on a chair and fell out of the chair and caught herself with her right arm.  She has been having some pain in her right upper back area.  She says it feels like muscle spasms.  She did not have any pain initially after the fall but it has been coming and going.  It comes on with certain movements.  She denies any shortness of breath.  No other injuries from the fall.  She did not hit her head.  No loss of consciousness.  No nausea or vomiting.       Home Medications Prior to Admission medications   Medication Sig Start Date End Date Taking? Authorizing Provider  cyclobenzaprine (FLEXERIL) 10 MG tablet Take 1 tablet (10 mg total) by mouth 2 (two) times daily as needed for muscle spasms. 09/10/22  Yes Malvin Johns, MD  aspirin EC 325 MG tablet Take 1 tablet (325 mg total) by mouth 2 (two) times daily. 04/20/14   Leighton Parody, PA-C  Aspirin-Salicylamide-Caffeine (BC HEADACHE POWDER PO) Take 1 packet by mouth daily as needed (for pain).    [provider]  cholecalciferol (VITAMIN D) 1000 UNITS tablet Take 1,000 Units by mouth every other day.    [provider]  escitalopram (LEXAPRO) 10 MG tablet Take 10 mg by mouth daily.      [provider]  ibuprofen (ADVIL,MOTRIN) 200 MG tablet Take 600 mg by mouth every 6 (six) hours as needed for fever, headache or mild pain.    [provider]  lisinopril (PRINIVIL,ZESTRIL) 10 MG tablet Take 10 mg by mouth daily.    [provider]  meloxicam (MOBIC) 7.5 MG tablet Take 2 tablets (15 mg total) by mouth daily. 11/05/19   Larene Pickett, PA-C  Multiple Vitamin (THERA) TABS Take by mouth.     [provider]  naproxen (NAPROSYN) 500 MG tablet Take 1 tablet (500 mg total) by mouth 2 (two) times daily. 08/09/18   Wynona Luna, MD  neomycin-bacitracin-polymyxin (NEOSPORIN) [phone] ointment Apply topically 4 (four) times daily. 04/14/22   Fransico Meadow, PA-C  potassium chloride (K-DUR) 10 MEQ tablet TAKE 1 TABLET(10 MEQ) BY MOUTH DAILY 01/24/18   [provider]  predniSONE (DELTASONE) 10 MG tablet Take 2 tablets (20 mg total) by mouth daily. 11/02/21   Domenic Moras, PA-C  vitamin B-12 (CYANOCOBALAMIN) 1000 MCG tablet Take 1,000 mcg by mouth every other day.    [provider]      Allergies    Lisinopril and Other    Review of Systems   Review of Systems  Constitutional:  Negative for chills, diaphoresis, fatigue and fever.  HENT:  Negative for congestion, rhinorrhea and sneezing.   Eyes: Negative.   Respiratory:  Negative for cough, chest tightness and shortness of breath.   Cardiovascular:  Negative for chest pain and leg swelling.  Gastrointestinal:  Negative for abdominal pain, blood in stool, diarrhea, nausea and vomiting.  Genitourinary:  Negative for difficulty urinating, flank pain, frequency and hematuria.  Musculoskeletal:  Positive for back pain. Negative for arthralgias.  Skin:  Negative for rash.  Neurological:  Negative  for dizziness, speech difficulty, weakness, numbness and headaches.    Physical Exam Updated Vital Signs BP 128/81 (BP Location: Right Arm)   Pulse (!) 57   Temp 98.3 F (36.8 C)   Resp 18   SpO2 96%  Physical Exam Constitutional:      Appearance: She is well-developed.  HENT:     Head: Normocephalic and atraumatic.  Eyes:     Pupils: Pupils are equal, round, and reactive to light.  Cardiovascular:     Rate and Rhythm: Normal rate and regular rhythm.     Heart sounds: Normal heart sounds.  Pulmonary:     Effort: Pulmonary effort is normal. No respiratory distress.     Breath sounds: Normal breath  sounds. No wheezing or rales.  Chest:     Chest wall: No tenderness.  Abdominal:     General: Bowel sounds are normal.     Palpations: Abdomen is soft.     Tenderness: There is no abdominal tenderness. There is no guarding or rebound.  Musculoskeletal:        General: Normal range of motion.     Cervical back: Normal range of motion and neck supple.     Comments: Patient has some mild tenderness to her right upper back in the musculature.  It is little bit reproducible but mostly she says it comes on with certain movements.  No crepitus or deformity along her ribs.  No pain with palpation or range of motion of her extremities.  Distal pulses are intact.  No visible external trauma noted.  Lymphadenopathy:     Cervical: No cervical adenopathy.  Skin:    General: Skin is warm and dry.     Findings: No rash.  Neurological:     Mental Status: She is alert and oriented to person, place, and time.     ED Results / Procedures / Treatments   Labs (all labs ordered are listed, but only abnormal results are displayed) Labs Reviewed - No data to display  EKG None  Radiology DG Ribs Unilateral W/Chest Right  Result Date: 09/10/2022 CLINICAL DATA:  61 year old female with history of trauma from a fall complaining of upper back pain. EXAM: RIGHT RIBS AND CHEST - 3+ VIEW COMPARISON:  Chest x-ray 12/10/2018. FINDINGS: Lung volumes are normal. No consolidative airspace disease. No pleural effusions. No pneumothorax. No pulmonary nodule or mass noted. Pulmonary vasculature and the cardiomediastinal silhouette are within normal limits. Atherosclerosis in the thoracic aorta. Dedicated views of the right ribs demonstrate no definite acute displaced right-sided rib fractures. IMPRESSION: 1. No acute displaced right-sided rib fractures. 2. No evidence of significant acute traumatic injury to the thorax. 3. Aortic atherosclerosis. Electronically Signed   By: Vinnie Langton M.D.   On: 09/10/2022 08:33     Procedures Procedures    Medications Ordered in ED Medications - No data to display  ED Course/ Medical Decision Making/ A&P                           Medical Decision Making Amount and/or Complexity of Data Reviewed Radiology: ordered.  Risk Prescription drug management.   Patient presents with some pain to her right upper back after a fall yesterday.  She had x-rays of her ribs which showed no obvious rib fracture.  No pneumothorax.  These were interpreted by me and confirmed by the radiologist.  I suspect her symptoms are more muscular in nature.  She has medication such  as gabapentin at home to use.  Will also start her on a muscle relaxer.  Was given a prescription for Flexeril.  She was encouraged to follow-up with her primary care doctor if her symptoms are not improving.  Return precautions were given.  Final Clinical Impression(s) / ED Diagnoses Final diagnoses:  Muscle strain    Rx / DC Orders ED Discharge Orders          Ordered    cyclobenzaprine (FLEXERIL) 10 MG tablet  2 times daily PRN        09/10/22 1024              Malvin Johns, MD 09/10/22 1027

## 2022-09-10 NOTE — ED Notes (Signed)
Discharge paperwork given and verbally understood. 

## 2022-09-10 NOTE — ED Triage Notes (Signed)
[place] backwards out of office chair, slid out from under her. About 3 am that morning she had sudden sharp pain from right lumbar to under shoulder blade, felt like spasm, hard to catch breath. Tylenol ,and gabapentin helped some but pain got worse. Doesn't hurt worse with deep breath but does with moving. Pain is now around her right lung area in the back.

## 2022-09-10 NOTE — ED Provider Triage Note (Signed)
Emergency Medicine Provider Triage Evaluation Note  [name] , a 62 y.o. female  was evaluated in triage.  Pt complains of right upper back pain after a fall yesterday.  Review of Systems  Positive: Muscle spasm/pain in her right upper back on her shoulder blade that comes and goes Negative: No numbness or weakness in her arm.  No pain along the spine  Physical Exam  BP (!) 154/103 (BP Location: Right Arm)   Pulse 80   Temp 98.3 F (36.8 C)   Resp 18   SpO2 99%  Gen:   Awake, no distress    Resp:  Normal effort   MSK:   Moves extremities without difficulty   Other:     Medical Decision Making  Medically screening exam initiated at 8:06 AM.  Appropriate orders placed.  [name] was informed that the remainder of the evaluation will be completed by another provider, this initial triage assessment does not replace that evaluation, and the importance of remaining in the ED until their evaluation is complete.      Malvin Johns, MD 09/10/22 [phone]

## 2023-10-03 ENCOUNTER — Ambulatory Visit: Payer: Medicaid Other | Admitting: Cardiovascular Disease

## 2023-10-10 ENCOUNTER — Encounter: Payer: Self-pay | Admitting: Cardiovascular Disease

## 2023-10-10 ENCOUNTER — Ambulatory Visit: Payer: Medicaid Other | Attending: Cardiovascular Disease | Admitting: Cardiovascular Disease

## 2023-10-10 VITALS — BP 110/74 | HR 77 | Ht 62.0 in | Wt 253.6 lb

## 2023-10-10 DIAGNOSIS — F17200 Nicotine dependence, unspecified, uncomplicated: Secondary | ICD-10-CM

## 2023-10-10 DIAGNOSIS — R011 Cardiac murmur, unspecified: Secondary | ICD-10-CM | POA: Diagnosis not present

## 2023-10-10 DIAGNOSIS — E785 Hyperlipidemia, unspecified: Secondary | ICD-10-CM | POA: Insufficient documentation

## 2023-10-10 DIAGNOSIS — I10 Essential (primary) hypertension: Secondary | ICD-10-CM

## 2023-10-10 DIAGNOSIS — E782 Mixed hyperlipidemia: Secondary | ICD-10-CM

## 2023-10-10 NOTE — Assessment & Plan Note (Signed)
 History of hyperlipidemia currently not on statin therapy.  Her most recent lipid profile performed 05/23/2022 revealed total cholesterol 245, LDL 158 and HDL of 68.  I am going to get a coronary calcium score to her stratify.  Ultimately, she will probably require statin therapy.

## 2023-10-10 NOTE — Assessment & Plan Note (Signed)
 25-pack-year tobacco abuse having quit 2 months ago after a diagnosis of rectal cancer.

## 2023-10-10 NOTE — Assessment & Plan Note (Signed)
 History of essential hypertension with blood pressure measured today at 110/74.  She is on amlodipine and lisinopril .

## 2023-10-10 NOTE — Assessment & Plan Note (Signed)
 Her OB/GYN,, Dr. Anders Katz, apparently auscultated a soft murmur for which he was referred today.  On my exam she does have a soft outflow tract murmur which does not sound pathologic.  I suspect this is a flow murmur.  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  I am going to check a 2D echo to further evaluate.

## 2023-10-10 NOTE — Patient Instructions (Signed)
 Medication Instructions:  Your physician recommends that you continue on your current medications as directed. Please refer to the Current Medication list given to you today.  *If you need a refill on your cardiac medications before your next appointment, please call your pharmacy*   Lab Work: Your physician recommends that you return for lab work in: the next week or 2 for FASTING lipid/liver panel.  If you have labs (blood work) drawn today and your tests are completely normal, you will receive your results only by: MyChart Message (if you have MyChart) OR A paper copy in the mail If you have any lab test that is abnormal or we need to change your treatment, we will call you to review the results.   Testing/Procedures: Your physician has requested that you have an echocardiogram. Echocardiography is a painless test that uses sound waves to create images of your heart. It provides your doctor with information about the size and shape of your heart and how well your heart's chambers and valves are working. This procedure takes approximately one hour. There are no restrictions for this procedure. Please do NOT wear cologne, perfume, aftershave, or lotions (deodorant is allowed). Please arrive 15 minutes prior to your appointment time.  Please note: We ask at that you not bring children with you during ultrasound (echo/ vascular) testing. Due to room size and safety concerns, children are not allowed in the ultrasound rooms during exams. Our front office staff cannot provide observation of children in our lobby area while testing is being conducted. An adult accompanying a patient to their appointment will only be allowed in the ultrasound room at the discretion of the ultrasound technician under special circumstances. We apologize for any inconvenience.   Dr. Katheryne Pane has ordered a CT coronary calcium score.   Test locations:  [facility]  [facility]  [facility]  This is $99 out of pocket.   Coronary CalciumScan A coronary calcium scan is an imaging test used to look for deposits of calcium and other fatty materials (plaques) in the inner lining of the blood vessels of the heart (coronary arteries). These deposits of calcium and plaques can partly clog and narrow the coronary arteries without producing any symptoms or warning signs. This puts a person at risk for a heart attack. This test can detect these deposits before symptoms develop. Tell a health care provider about: Any allergies you have. All medicines you are taking, including vitamins, herbs, eye drops, creams, and over-the-counter medicines. Any problems you or family members have had with anesthetic medicines. Any blood disorders you have. Any surgeries you have had. Any medical conditions you have. Whether you are pregnant or may be pregnant. What are the risks? Generally, this is a safe procedure. However, problems may occur, including: Harm to a pregnant woman and her unborn baby. This test involves the use of radiation. Radiation exposure can be dangerous to a pregnant woman and her unborn baby. If you are pregnant, you generally should not have this procedure done. Slight increase in the risk of cancer. This is because of the radiation involved in the test. What happens before the procedure? No preparation is needed for this procedure. What happens during the procedure? You will undress and remove any jewelry around your neck or chest. You will put on a hospital gown. Sticky electrodes will be placed on your chest. The electrodes will be connected to an electrocardiogram (ECG) machine to record  a tracing of the electrical activity of your heart. A CT scanner will take pictures of your heart. During this time, you will be asked to lie still and hold your breath for 2-3 seconds while a picture of your heart is being taken. The  procedure may vary among health care providers and hospitals. What happens after the procedure? You can get dressed. You can return to your normal activities. It is up to you to get the results of your test. Ask your health care provider, or the department that is doing the test, when your results will be ready. Summary A coronary calcium scan is an imaging test used to look for deposits of calcium and other fatty materials (plaques) in the inner lining of the blood vessels of the heart (coronary arteries). Generally, this is a safe procedure. Tell your health care provider if you are pregnant or may be pregnant. No preparation is needed for this procedure. A CT scanner will take pictures of your heart. You can return to your normal activities after the scan is done. This information is not intended to replace advice given to you by your health care provider. Make sure you discuss any questions you have with your health care provider. Document Released: 03/09/2008 Document Revised: 07/31/2016 Document Reviewed: 07/31/2016 Elsevier Interactive Patient Education  2017 [employer].     Follow-Up: At [facility], you and your health needs are our priority.  As part of our continuing mission to provide you with exceptional heart care, we have created designated Provider Care Teams.  These Care Teams include your primary Cardiologist (physician) and Advanced Practice Providers (APPs -  Physician Assistants and Nurse Practitioners) who all work together to provide you with the care you need, when you need it.  We recommend signing up for the patient portal called "MyChart".  Sign up information is provided on this After Visit Summary.  MyChart is used to connect with patients for Virtual Visits (Telemedicine).  Patients are able to view lab/test results, encounter notes, upcoming appointments, etc.  Non-urgent messages can be sent to your provider as well.   To learn more about what you  can do with MyChart, go to [email or web address].    Your next appointment:   3 month(s)  Provider:   Lauro Portal, MD   Other Instructions

## 2023-10-10 NOTE — Progress Notes (Signed)
 Patient     10/10/2023 [name]   Jul 15, 1961  [id number]  Primary Physician Emaline Handsome, MD Primary Cardiologist: Avanell Leigh MD Bennye Bravo, [place]  HPI:  [name] is a 63 y.o. morbidly overweight single African-American female mother of 1 daughter with no grandchildren who was referred by Dr. Cloretta Danes  , her OB/GYN, because of an auscultated murmur.  She is accompanied by her daughter Tennia today.  She works as a Lawyer for [employer].  Her risk factors are notable for treated hypertension and untreated hyperlipidemia.  There is no family history of heart disease.  She is never had a heart attack or stroke.  She denies chest pain shortness of breath.  She was fairly active until recently.  She had a diagnosis made of rectal cancer 2 months ago at which time she stopped smoking.  She is being treated at [facility].  She did have a chest CT for cancer screening 06/14/2022 which revealed aortic atherosclerosis.   Current Meds  Medication Sig   albuterol (PROVENTIL) (2.5 MG/3ML) 0.083% nebulizer solution Take 2.5 mg by nebulization as needed.   amLODipine (NORVASC) 10 MG tablet Take 10 mg by mouth daily.   apixaban (ELIQUIS) 5 MG TABS tablet Take 5 mg by mouth 2 (two) times daily.   ascorbic acid (VITAMIN C) 100 MG tablet Take 100 mg by mouth daily.   azithromycin (ZITHROMAX) 500 MG tablet Take 500 mg by mouth daily.   cefdinir (OMNICEF) 300 MG capsule Take 300 mg by mouth 2 (two) times daily.   cholecalciferol  (VITAMIN D) 1000 UNITS tablet Take 1,000 Units by mouth every other day.   ENBREL SURECLICK 50 MG/ML injection Inject 50 mg into the skin once a week.   escitalopram  (LEXAPRO ) 10 MG tablet Take 10 mg by mouth daily.     furosemide (LASIX) 20 MG tablet Take 20 mg by mouth daily.   guaiFENesin-codeine 100-10 MG/5ML syrup Take 5 mLs by mouth as needed.   ibuprofen (ADVIL,MOTRIN) 200 MG tablet Take 600 mg by mouth every 6 (six) hours as  needed for fever, headache or mild pain.   meloxicam  (MOBIC ) 7.5 MG tablet Take 2 tablets (15 mg total) by mouth daily.   metoprolol succinate (TOPROL-XL) 50 MG 24 hr tablet Take 50 mg by mouth daily.   potassium chloride  (K-DUR) 10 MEQ tablet TAKE 1 TABLET(10 MEQ) BY MOUTH DAILY   SYMBICORT 160-4.5 MCG/ACT inhaler Inhale 2 puffs into the lungs as needed.   vitamin B-12 (CYANOCOBALAMIN ) 1000 MCG tablet Take 1,000 mcg by mouth every other day.     Allergies  Allergen Reactions   Lisinopril  Anaphylaxis    Lip edema Lip edema    Other     Patient does not do well with anesthesia.    Social History   Socioeconomic History   Marital status: Single    Spouse name: Not on file   Number of children: Not on file   Years of education: Not on file   Highest education level: Not on file  Occupational History   Not on file  Tobacco Use   Smoking status: Every Day    Current packs/day: 0.25    Average packs/day: 0.3 packs/day for 25.0 years (6.3 ttl pk-yrs)    Types: Cigarettes   Smokeless tobacco: Never  Substance and Sexual Activity   Alcohol use: Yes    Alcohol/week: 2.0 standard drinks of alcohol    Types: 2 Standard drinks or equivalent  per week    Comment: social   Drug use: No   Sexual activity: Not on file  Other Topics Concern   Not on file  Social History Narrative   Not on file   Social Drivers of Health   Financial Resource Strain: High Risk (10/09/2023)   Received from [facility]   Overall Financial Resource Strain (CARDIA)    Difficulty of Paying Living Expenses: Very hard  Food Insecurity: Food Insecurity Present (10/09/2023)   Received from [facility]   Hunger Vital Sign    Worried About Running Out of Food in the Last Year: Sometimes true    Ran Out of Food in the Last Year: Sometimes true  Transportation Needs: No Transportation Needs (10/09/2023)   Received from [facility] - Transportation    Lack of Transportation (Medical): No     Lack of Transportation (Non-Medical): No  Physical Activity: Insufficiently Active (03/17/2022)   Received from [facility], [facility]   Exercise Vital Sign    Days of Exercise per Week: 1 day    Minutes of Exercise per Session: 30 min  Stress: No Stress Concern Present (03/17/2022)   Received from [facility], [facility] - Occupational Stress Questionnaire    Feeling of Stress : Not at all  Social Connections: Unknown (01/22/2022)   Received from [facility], [facility]   Social Network    Social Network: Not on file  Intimate Partner Violence: Unknown (05/15/2023)   Received from [facility]   HITS    Physically Hurt: Not on file    Insult or Talk Down To: Not on file    Threaten Physical Harm: Not on file    Scream or Curse: Not on file     Review of Systems: General: negative for chills, fever, night sweats or weight changes.  Cardiovascular: negative for chest pain, dyspnea on exertion, edema, orthopnea, palpitations, paroxysmal nocturnal dyspnea or shortness of breath Dermatological: negative for rash Respiratory: negative for cough or wheezing Urologic: negative for hematuria Abdominal: negative for nausea, vomiting, diarrhea, bright red blood per rectum, melena, or hematemesis Neurologic: negative for visual changes, syncope, or dizziness All other systems reviewed and are otherwise negative except as noted above.    Blood pressure 110/74, pulse 77, height 5\' 2"  (1.575 m), weight 253 lb 9.6 oz (115 kg), SpO2 94%.  General appearance: alert and no distress Neck: no adenopathy, no carotid bruit, no JVD, supple, symmetrical, trachea midline, and thyroid not enlarged, symmetric, no tenderness/mass/nodules Lungs: clear to auscultation bilaterally Heart: Soft outflow tract murmur Extremities: extremities normal, atraumatic, no cyanosis or edema Pulses: 2+ and symmetric Skin: Skin color, texture, turgor normal. No  rashes or lesions Neurologic: Grossly normal  EKG EKG Interpretation Date/Time:  Wednesday October 10 2023 11:27:57 EST Ventricular Rate:  77 PR Interval:  204 QRS Duration:  78 QT Interval:  372 QTC Calculation: 420 R Axis:   68  Text Interpretation: Normal sinus rhythm Normal ECG When compared with ECG of 10-Dec-2018 17:45, PREVIOUS ECG IS PRESENT Confirmed by Lauro Portal [phone]) on 10/10/2023 11:47:50 AM    ASSESSMENT AND PLAN:   TOBACCO ABUSE 25-pack-year tobacco abuse having quit 2 months ago after a diagnosis of rectal cancer.  HYPERTENSION, BENIGN ESSENTIAL History of essential hypertension with blood pressure measured today at 110/74.  She is on amlodipine and lisinopril .  Hyperlipidemia History of hyperlipidemia currently not on statin therapy.  Her most recent lipid  profile performed 05/23/2022 revealed total cholesterol 245, LDL 158 and HDL of 68.  I am going to get a coronary calcium score to her stratify.  Ultimately, she will probably require statin therapy.  Cardiac murmur Her OB/GYN,, Dr. Anders Katz, apparently auscultated a soft murmur for which he was referred today.  On my exam she does have a soft outflow tract murmur which does not sound pathologic.  I suspect this is a flow murmur.  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  I am going to check a 2D echo to further evaluate.     Avanell Leigh MD FACP,FACC,FAHA, [facility] 10/10/2023 12:02 PM

## 2023-11-29 ENCOUNTER — Other Ambulatory Visit (HOSPITAL_BASED_OUTPATIENT_CLINIC_OR_DEPARTMENT_OTHER): Payer: Medicaid Other

## 2024-01-17 ENCOUNTER — Ambulatory Visit (HOSPITAL_COMMUNITY): Payer: Medicaid Other

## 2024-03-05 ENCOUNTER — Ambulatory Visit (HOSPITAL_COMMUNITY): Attending: Cardiology

## 2024-03-21 ENCOUNTER — Ambulatory Visit: Payer: Self-pay | Admitting: Cardiovascular Disease

## 2024-03-21 ENCOUNTER — Ambulatory Visit (HOSPITAL_COMMUNITY)
Admission: RE | Admit: 2024-03-21 | Discharge: 2024-03-21 | Disposition: A | Discharge status: Home / Self Care | Source: Ambulatory Visit | Attending: Cardiology | Admitting: Cardiology

## 2024-03-21 DIAGNOSIS — I10 Essential (primary) hypertension: Secondary | ICD-10-CM

## 2024-03-21 DIAGNOSIS — R011 Cardiac murmur, unspecified: Secondary | ICD-10-CM

## 2024-03-21 DIAGNOSIS — E782 Mixed hyperlipidemia: Secondary | ICD-10-CM | POA: Insufficient documentation

## 2024-03-21 LAB — ECHOCARDIOGRAM COMPLETE
AR max vel: 1.46 cm2
AV Area VTI: 1.51 cm2
AV Area mean vel: 1.46 cm2
AV Mean grad: 9.5 mmHg
AV Peak grad: 18.2 mmHg
Ao pk vel: 2.14 m/s
Area-P 1/2: 4.04 cm2
S' Lateral: 2.5 cm

## 2024-07-29 ENCOUNTER — Other Ambulatory Visit: Payer: Self-pay | Admitting: Obstetrics and Gynecology

## 2024-07-29 DIAGNOSIS — Z720 Tobacco use: Secondary | ICD-10-CM

## 2024-08-06 ENCOUNTER — Telehealth: Payer: Self-pay | Admitting: Family Medicine

## 2024-08-06 NOTE — Telephone Encounter (Signed)
 LVM in regards to referral received from Dr. Leva to establish care w/  a PCP.

## 2024-09-30 ENCOUNTER — Telehealth: Payer: Self-pay | Admitting: Family Medicine

## 2024-09-30 NOTE — Telephone Encounter (Signed)
 Called pt and informed her of available providers. Patient states she will touch back in once she has procedure done.

## 2024-10-02 ENCOUNTER — Inpatient Hospital Stay: Admission: RE | Admit: 2024-10-02 | Source: Ambulatory Visit
